# Patient Record
Sex: Male | Born: 1970 | Race: Black or African American | Hispanic: No | Marital: Married | Smoking: Never smoker
Health system: Southern US, Community
[De-identification: ages and names within clinical notes are randomized; demographics above are authoritative.]

## PROBLEM LIST (undated history)

## (undated) DIAGNOSIS — N419 Inflammatory disease of prostate, unspecified: Secondary | ICD-10-CM

## (undated) DIAGNOSIS — R972 Elevated prostate specific antigen [PSA]: Secondary | ICD-10-CM

## (undated) DIAGNOSIS — N390 Urinary tract infection, site not specified: Secondary | ICD-10-CM

## (undated) DIAGNOSIS — T7840XA Allergy, unspecified, initial encounter: Secondary | ICD-10-CM

## (undated) DIAGNOSIS — J309 Allergic rhinitis, unspecified: Secondary | ICD-10-CM

## (undated) HISTORY — DX: Allergic rhinitis, unspecified: J30.9

## (undated) HISTORY — DX: Urinary tract infection, site not specified: N39.0

## (undated) HISTORY — DX: Allergy, unspecified, initial encounter: T78.40XA

## (undated) HISTORY — DX: Inflammatory disease of prostate, unspecified: N41.9

## (undated) HISTORY — PX: OTHER SURGICAL HISTORY: SHX169

## (undated) HISTORY — DX: Elevated prostate specific antigen (PSA): R97.20

---

## 2003-07-17 ENCOUNTER — Emergency Department (HOSPITAL_COMMUNITY): Admission: EM | Admit: 2003-07-17 | Discharge: 2003-07-17 | Payer: Self-pay | Admitting: Emergency Medicine

## 2004-07-10 ENCOUNTER — Emergency Department (HOSPITAL_COMMUNITY): Admission: EM | Admit: 2004-07-10 | Discharge: 2004-07-10 | Payer: Self-pay | Admitting: Emergency Medicine

## 2009-01-30 ENCOUNTER — Emergency Department (HOSPITAL_COMMUNITY): Admission: EM | Admit: 2009-01-30 | Discharge: 2009-01-30 | Payer: Self-pay | Admitting: Emergency Medicine

## 2010-10-09 LAB — POCT I-STAT, CHEM 8
BUN: 12 mg/dL (ref 6–23)
Creatinine, Ser: 1.2 mg/dL (ref 0.4–1.5)
Glucose, Bld: 99 mg/dL (ref 70–99)
HCT: 43 % (ref 39.0–52.0)
Potassium: 3.8 mEq/L (ref 3.5–5.1)
Sodium: 139 mEq/L (ref 135–145)

## 2010-10-09 LAB — URINALYSIS, ROUTINE W REFLEX MICROSCOPIC
Bilirubin Urine: NEGATIVE
Glucose, UA: NEGATIVE mg/dL
Hgb urine dipstick: NEGATIVE
Specific Gravity, Urine: 1.009 (ref 1.005–1.030)
Urobilinogen, UA: 0.2 mg/dL (ref 0.0–1.0)

## 2017-05-05 ENCOUNTER — Ambulatory Visit (INDEPENDENT_AMBULATORY_CARE_PROVIDER_SITE_OTHER): Payer: No Typology Code available for payment source | Admitting: Physician Assistant

## 2017-05-05 ENCOUNTER — Encounter: Payer: Self-pay | Admitting: Physician Assistant

## 2017-05-05 VITALS — BP 120/78 | HR 96 | Temp 98.5°F | Resp 18 | Ht 68.0 in | Wt 177.2 lb

## 2017-05-05 DIAGNOSIS — R300 Dysuria: Secondary | ICD-10-CM | POA: Diagnosis not present

## 2017-05-05 DIAGNOSIS — Z6829 Body mass index (BMI) 29.0-29.9, adult: Secondary | ICD-10-CM | POA: Insufficient documentation

## 2017-05-05 LAB — POCT URINALYSIS DIP (MANUAL ENTRY)
BILIRUBIN UA: NEGATIVE
GLUCOSE UA: NEGATIVE mg/dL
Ketones, POC UA: NEGATIVE mg/dL
Nitrite, UA: NEGATIVE
Protein Ur, POC: NEGATIVE mg/dL
RBC UA: NEGATIVE
SPEC GRAV UA: 1.015 (ref 1.010–1.025)
Urobilinogen, UA: 2 E.U./dL — AB
pH, UA: 6.5 (ref 5.0–8.0)

## 2017-05-05 LAB — POCT CBC
GRANULOCYTE PERCENT: 68.8 % (ref 37–80)
HCT, POC: 41.1 % — AB (ref 43.5–53.7)
Hemoglobin: 14.1 g/dL (ref 14.1–18.1)
Lymph, poc: 1 (ref 0.6–3.4)
MCH: 27.6 pg (ref 27–31.2)
MCHC: 34.4 g/dL (ref 31.8–35.4)
MCV: 80.4 fL (ref 80–97)
MID (CBC): 0.8 (ref 0–0.9)
MPV: 8.1 fL (ref 0–99.8)
POC Granulocyte: 4.1 (ref 2–6.9)
POC LYMPH %: 17.2 % (ref 10–50)
POC MID %: 14 % — AB (ref 0–12)
Platelet Count, POC: 151 10*3/uL (ref 142–424)
RBC: 5.12 M/uL (ref 4.69–6.13)
RDW, POC: 12.8 %
WBC: 6 10*3/uL (ref 4.6–10.2)

## 2017-05-05 LAB — POC MICROSCOPIC URINALYSIS (UMFC): MUCUS RE: ABSENT

## 2017-05-05 MED ORDER — SULFAMETHOXAZOLE-TRIMETHOPRIM 800-160 MG PO TABS: 1.0000 | ORAL_TABLET | Freq: Two times a day (BID) | ORAL | 0 refills | Status: AC

## 2017-05-05 NOTE — Progress Notes (Signed)
   Subjective:    Patient ID: Phillip Mccoy, male    DOB: [birth year], 46 y.o.   MRN: [id number]  HPI   Phillip Mccoy is a 46 year old African American male with a family history significant for prostate cancer who presents today with a chief complaint of "prostate issues". Phillip Mccoy reports he was seen at [facility] 04/18/17 with urinary symptoms and fever that had been occurring for the past 3 days. At that time he tested negative for STDs as well as urinary tract infection. He was treated with a 7 day course of Keflex 500mg  4 times per day. Phillip Mccoy reports the symptoms resolved but returned 5 days after finishing the course of antibiotics. Phillip Mccoy states his urine stream will initially start off strong, but then will start to dribble. Patient reports he will have a burning sensation on and off. He also states he had a fever and chills on 05/03/17. Phillip Mccoy took Tylenol helped which helped with the fever, but did not help the other symptoms. He endorse discomfort, but denies any pain. Phillip Mccoy states nothing seems to make the symptoms worse or better. He denies any changes in urine color or hematuria.  Phillip Mccoy reports he is married and occasionally has sexual intercourse with his wife. He denies any other sexual partners. He denies any erectile dysfunction or issues with ejaculation. He also denies penile discharge.  Medications:  Prior to Admission medications   Not on File   Allergies:  Allergies  Allergen Reactions  . Penicillins Rash    This is a new finding as of 05/2012   Chronic Medical Conditions:  Patient Active Problem List   Diagnosis Date Noted  . BMI 29.0-29.9,adult 05/05/2017   Family History:  Family History  Problem Relation Age of Onset  . Diabetes Mother   . Hyperlipidemia Mother   . Cancer Father   . Hyperlipidemia Father     Review of Systems     Objective:   Physical Exam  Constitutional: He appears well-developed and well-nourished. He is active and  cooperative. No distress.  BP 120/78 (BP Location: Left Arm, Patient Position: Sitting, Cuff Size: Normal)   Pulse 96   Temp 98.5 F (36.9 C) (Oral)   Resp 18   Ht 5\' 8"  (1.727 m)   Wt 177 lb 3.2 oz (80.4 kg)   SpO2 98%   BMI 26.94 kg/m    HENT:  Head: Normocephalic and atraumatic.  Neck: Normal range of motion. Neck supple. No thyromegaly present.  Cardiovascular: Normal rate, regular rhythm, S1 normal, S2 normal, normal heart sounds and intact distal pulses.   No murmur heard. Pulses:      Radial pulses are 2+ on the right side, and 2+ on the left side.  Pulmonary/Chest: Effort normal and breath sounds normal.  Lymphadenopathy:    He has no cervical adenopathy.  Neurological: He is alert.  Skin: Skin is warm and dry.      Assessment & Plan:  1. Dysuria - POCT urinalysis dipstick obtained today in clinic - POCT Microscopic Urinalysis ([facility]) obtained today in clinic - POCT CBC obtained today in clinic - Urine sent for culture - PSA obtained today in clinic - Sulfamethoxazole-trimethoprim 800-160mg  BID  - Patient to follow-up in 4 weeks for re-evaluation or sooner if symptoms do not resolve  Sisto Granillo, PA-S

## 2017-05-05 NOTE — Patient Instructions (Signed)
     IF you received an x-ray today, you will receive an invoice from [facility]. Please contact [facility] at [phone] with questions or concerns regarding your invoice.   IF you received labwork today, you will receive an invoice from [facility]. Please contact [facility] at [phone] with questions or concerns regarding your invoice.   Our billing staff will not be able to assist you with questions regarding bills from these companies.  You will be contacted with the lab results as soon as they are available. The fastest way to get your results is to activate your My Chart account. Instructions are located on the last page of this paperwork. If you have not heard from us regarding the results in 2 weeks, please contact this office.     

## 2017-05-05 NOTE — Progress Notes (Signed)
Patient ID: Phillip Mccoy, male     DOB: 1970/12/30, 46 y.o.    MRN: [id number]  PCP: Biagio Borg, MD  Chief Complaint  Patient presents with  . Dysuria    x3 weeks, pt reports fever, burning sensation, leakage, pt states he went to [facility] (x2 weeks ago) and the ruled out STDs and UTI and was given an antibotic and the symptoms cleared up but they return on Monday. Pt thinks he may be a prostate infection.    Subjective:   This patient is new to me and presents for evaluation of possible prostate problems.  He first noticed symptoms about 3 weeks ago: fever, burning with urination, urinary leakage. He presented to another facility where he reports a very thorough evaluation, including UA that was reportedly normal, DRE which was uncomfortable but not painful, and STI screening that was ultimately negative. He was treated with cephalexin 500 mg QID x 7 days.  His symptoms resolved, but then recurred on 04/30/2017. He did some reading online and thinks he might have prostatitis.   Urinary burning is intermittent. Urine stream starts strong and then ends in a dribble. Low grade fever resolves temporarily with acetaminophen.  He is sexually active only with his wife, and infrequently. No penile discharge, lesions or swollen/tender lymph nodes.  Review of Systems As above. No nausea, vomiting, diarrhea.  Prior to Admission medications   Not on File     Allergies  Allergen Reactions  . Penicillins Rash    This is a new finding as of 05/2012     Patient Active Problem List   Diagnosis Date Noted  . BMI 29.0-29.9,adult 05/05/2017     Family History  Problem Relation Age of Onset  . Diabetes Mother   . Hyperlipidemia Mother   . Cancer Father        Bone cancer  . Hyperlipidemia Father   . COPD Sister   . Cancer Brother 56       prostate  . Lupus Sister      Social History   Social History  . Marital status: Married    Spouse name: N/A  . Number of  children: 1  . Years of education: some college   Occupational History  . [employer] painting [employer]    Social History Main Topics  . Smoking status: Never Smoker  . Smokeless tobacco: Never Used  . Alcohol use No  . Drug use: No  . Sexual activity: Yes    Partners: Female   Other Topics Concern  . Not on file   Social History Narrative   Lives with his wife and nieces (3).   His daughter is in college at [employer].         Objective:  Physical Exam  Constitutional: He is oriented to person, place, and time. He appears well-developed and well-nourished. He is active and cooperative. No distress.  BP 120/78 (BP Location: Left Arm, Patient Position: Sitting, Cuff Size: Normal)   Pulse 96   Temp 98.5 F (36.9 C) (Oral)   Resp 18   Ht 5\' 8"  (1.727 m)   Wt 177 lb 3.2 oz (80.4 kg)   SpO2 98%   BMI 26.94 kg/m   HENT:  Head: Normocephalic and atraumatic.  Right Ear: Hearing normal.  Left Ear: Hearing normal.  Eyes: Conjunctivae are normal. No scleral icterus.  Neck: Normal range of motion. Neck supple. No thyromegaly present.  Cardiovascular: Normal rate, regular rhythm and normal  heart sounds.   Pulses:      Radial pulses are 2+ on the right side, and 2+ on the left side.  Pulmonary/Chest: Effort normal and breath sounds normal.  Abdominal: Soft. Normal appearance and bowel sounds are normal. There is no hepatosplenomegaly. There is no tenderness. There is no CVA tenderness. No hernia.  Lymphadenopathy:       Head (right side): No tonsillar, no preauricular, no posterior auricular and no occipital adenopathy present.       Head (left side): No tonsillar, no preauricular, no posterior auricular and no occipital adenopathy present.    He has no cervical adenopathy.       Right: No supraclavicular adenopathy present.       Left: No supraclavicular adenopathy present.  Neurological: He is alert and oriented to person, place, and time. No sensory deficit.  Skin: Skin is warm, dry  and intact. No rash noted. No cyanosis or erythema. Nails show no clubbing.  Psychiatric: He has a normal mood and affect. His speech is normal and behavior is normal.         Results for orders placed or performed in visit on 05/05/17  POCT urinalysis dipstick  Result Value Ref Range   Color, UA yellow yellow   Clarity, UA clear clear   Glucose, UA negative negative mg/dL   Bilirubin, UA negative negative   Ketones, POC UA negative negative mg/dL   Spec Grav, UA 1.015 1.010 - 1.025   Blood, UA negative negative   pH, UA 6.5 5.0 - 8.0   Protein Ur, POC negative negative mg/dL   Urobilinogen, UA 2.0 (A) 0.2 or 1.0 E.U./dL   Nitrite, UA Negative Negative   Leukocytes, UA Small (1+) (A) Negative  POCT Microscopic Urinalysis ([facility])  Result Value Ref Range   WBC,UR,HPF,POC None None WBC/hpf   RBC,UR,HPF,POC None None RBC/hpf   Bacteria None None, Too numerous to count   Mucus Absent Absent   Epithelial Cells, UR Per Microscopy None None, Too numerous to count cells/hpf  POCT CBC  Result Value Ref Range   WBC 6.0 4.6 - 10.2 K/uL   Lymph, poc 1.0 0.6 - 3.4   POC LYMPH PERCENT 17.2 10 - 50 %L   MID (cbc) 0.8 0 - 0.9   POC MID % 14.0 (A) 0 - 12 %M   POC Granulocyte 4.1 2 - 6.9   Granulocyte percent 68.8 37 - 80 %G   RBC 5.12 4.69 - 6.13 M/uL   Hemoglobin 14.1 14.1 - 18.1 g/dL   HCT, POC 41.1 (A) 43.5 - 53.7 %   MCV 80.4 80 - 97 fL   MCH, POC 27.6 27 - 31.2 pg   MCHC 34.4 31.8 - 35.4 g/dL   RDW, POC 12.8 %   Platelet Count, POC 151 142 - 424 K/uL   MPV 8.1 0 - 99.8 fL       Assessment & Plan:  1. Dysuria Treat for prostatitis, pending UCx and PSA. Plan re-evaluation in 4 weeks, sooner if symptoms worsen/persist. Consider BPH if burning and fever resolve but other urinary symptoms persist. - POCT urinalysis dipstick - POCT Microscopic Urinalysis ([facility]) - POCT CBC - Urine Culture - PSA - sulfamethoxazole-trimethoprim (BACTRIM DS,SEPTRA DS) 800-160 MG tablet; Take 1  tablet by mouth 2 (two) times daily.  Dispense: 42 tablet; Refill: 0    Return in about 4 weeks (around 06/02/2017), or if symptoms worsen or fail to improve, for re-evaluation of prostate issues.   Gordon Vandunk  Janalee Dane, PA-C [facility]

## 2017-05-06 LAB — PSA: PROSTATE SPECIFIC AG, SERUM: 8.8 ng/mL — AB (ref 0.0–4.0)

## 2017-05-09 LAB — URINE CULTURE

## 2017-05-28 ENCOUNTER — Telehealth: Payer: Self-pay | Admitting: *Deleted

## 2017-05-28 ENCOUNTER — Telehealth: Payer: Self-pay | Admitting: Internal Medicine

## 2017-05-28 NOTE — Telephone Encounter (Signed)
Patient returned call regarding his lab results- patient notified of results and will call back if symptoms return. He states he is doing much better and plans to follow up as discussed.

## 2017-05-28 NOTE — Telephone Encounter (Signed)
Lm to call back for lab results 

## 2017-05-31 ENCOUNTER — Telehealth: Payer: Self-pay | Admitting: *Deleted

## 2017-05-31 NOTE — Telephone Encounter (Signed)
Per pt he is not having any problems. He stated that he feels good, he has taken all of his medication, if anything "happens", he will schedule an appointment. He will see Dr. Judi Cong in January.

## 2017-07-13 ENCOUNTER — Ambulatory Visit: Payer: No Typology Code available for payment source | Admitting: Internal Medicine

## 2017-07-13 ENCOUNTER — Encounter: Payer: Self-pay | Admitting: Internal Medicine

## 2017-07-13 DIAGNOSIS — N390 Urinary tract infection, site not specified: Secondary | ICD-10-CM | POA: Insufficient documentation

## 2017-07-13 DIAGNOSIS — Z0001 Encounter for general adult medical examination with abnormal findings: Secondary | ICD-10-CM | POA: Insufficient documentation

## 2017-07-13 DIAGNOSIS — J309 Allergic rhinitis, unspecified: Secondary | ICD-10-CM | POA: Insufficient documentation

## 2017-07-13 DIAGNOSIS — N419 Inflammatory disease of prostate, unspecified: Secondary | ICD-10-CM | POA: Insufficient documentation

## 2017-07-13 DIAGNOSIS — R972 Elevated prostate specific antigen [PSA]: Secondary | ICD-10-CM | POA: Insufficient documentation

## 2017-07-13 DIAGNOSIS — Z0000 Encounter for general adult medical examination without abnormal findings: Secondary | ICD-10-CM

## 2017-07-13 NOTE — Progress Notes (Signed)
Subjective:    Patient ID: Phillip Mccoy, male    DOB: September 23, 1970, 47 y.o.   MRN: [id number]  HPI:  Here for wellness and f/u;  Overall doing ok;  Pt denies Chest pain, worsening SOB, DOE, wheezing, orthopnea, PND, worsening LE edema, palpitations, dizziness or syncope.  Pt denies neurological change such as new headache, facial or extremity weakness.  Pt denies polydipsia, polyuria, or low sugar symptoms. Pt states overall good compliance with treatment and medications, good tolerability, and has been trying to follow appropriate diet.  Pt denies worsening depressive symptoms, suicidal ideation or panic. No fever, night sweats, wt loss, loss of appetite, or other constitutional symptoms.  Pt states good ability with ADL's, has low fall risk, home safety reviewed and adequate, no other significant changes in hearing or vision, and only occasionally active with exercise.  Also seen and tx for uti and prostatitis nov 2018 s/p 21 days antibx. Denies urinary symptoms such as dysuria, frequency, urgency, flank pain, hematuria or n/v, fever, chills.  Has some bloating and somewhat irreg bowel habit with mild recurring constipation he takes cathartic and does ok. Past Medical History:  Diagnosis Date  . Allergic rhinitis   . Elevated PSA   . Prostatitis   . UTI (urinary tract infection)    No past surgical history on file.  reports that  has never smoked. he has never used smokeless tobacco. He reports that he does not drink alcohol or use drugs. family history includes COPD in his sister; Cancer in his father; Cancer (age of onset: 60) in his brother; Diabetes in his mother; Hyperlipidemia in his father and mother; Lupus in his sister. Allergies  Allergen Reactions  . Penicillins Rash    This is a new finding as of 05/2012   No current outpatient medications on file prior to visit.   No current facility-administered medications on file prior to visit.    Review of Systems Constitutional:  Negative for other unusual diaphoresis, sweats, appetite or weight changes HENT: Negative for other worsening hearing loss, ear pain, facial swelling, mouth sores or neck stiffness.   Eyes: Negative for other worsening pain, redness or other visual disturbance.  Respiratory: Negative for other stridor or swelling Cardiovascular: Negative for other palpitations or other chest pain  Gastrointestinal: Negative for worsening diarrhea or loose stools, blood in stool, distention or other pain Genitourinary: Negative for hematuria, flank pain or other change in urine volume.  Musculoskeletal: Negative for myalgias or other joint swelling.  Skin: Negative for other color change, or other wound or worsening drainage.  Neurological: Negative for other syncope or numbness. Hematological: Negative for other adenopathy or swelling Psychiatric/Behavioral: Negative for hallucinations, other worsening agitation, SI, self-injury, or new decreased concentration All other system neg per pt    Objective:   Physical Exam BP 120/76   Pulse 80   Temp 97.9 F (36.6 C) (Oral)   Ht 5\' 8"  (1.727 m)   Wt 180 lb (81.6 kg)   SpO2 98%   BMI 27.37 kg/m  VS noted,  Constitutional: Pt is oriented to person, place, and time. Appears well-developed and well-nourished, in no significant distress and comfortable Head: Normocephalic and atraumatic  Eyes: Conjunctivae and EOM are normal. Pupils are equal, round, and reactive to light Right Ear: External ear normal without discharge Left Ear: External ear normal without discharge Nose: Nose without discharge or deformity Mouth/Throat: Oropharynx is without other ulcerations and moist  Neck: Normal range of motion. Neck supple. No  JVD present. No tracheal deviation present or significant neck LA or mass Cardiovascular: Normal rate, regular rhythm, normal heart sounds and intact distal pulses.   Pulmonary/Chest: WOB normal and breath sounds without rales or wheezing    Abdominal: Soft. Bowel sounds are normal. NT. No HSM  Musculoskeletal: Normal range of motion. Exhibits no edema Lymphadenopathy: Has no other cervical adenopathy.  Neurological: Pt is alert and oriented to person, place, and time. Pt has normal reflexes. No cranial nerve deficit. Motor grossly intact, Gait intact Skin: Skin is warm and dry. No rash noted or new ulcerations Psychiatric:  Has normal mood and affect. Behavior is normal without agitation No other exam findings    Assessment & Plan:

## 2017-07-13 NOTE — Patient Instructions (Signed)
Please continue all other medications as before, and refills have been done if requested.  Please have the pharmacy call with any other refills you may need.  Please continue your efforts at being more active, low cholesterol diet, and weight control.  You are otherwise up to date with prevention measures today.  Please keep your appointments with your specialists as you may have planned  Please return in 1 year for your yearly visit, or sooner if needed, with Lab testing done 3-5 days before  

## 2017-07-15 ENCOUNTER — Encounter: Payer: Self-pay | Admitting: Internal Medicine

## 2017-07-15 NOTE — Assessment & Plan Note (Signed)

## 2018-05-10 ENCOUNTER — Ambulatory Visit (INDEPENDENT_AMBULATORY_CARE_PROVIDER_SITE_OTHER)
Admission: RE | Admit: 2018-05-10 | Discharge: 2018-05-10 | Disposition: A | Discharge status: Home / Self Care | Payer: No Typology Code available for payment source | Source: Ambulatory Visit | Attending: Internal Medicine | Admitting: Internal Medicine

## 2018-05-10 ENCOUNTER — Encounter: Payer: Self-pay | Admitting: Internal Medicine

## 2018-05-10 ENCOUNTER — Ambulatory Visit: Payer: No Typology Code available for payment source | Admitting: Internal Medicine

## 2018-05-10 ENCOUNTER — Other Ambulatory Visit (INDEPENDENT_AMBULATORY_CARE_PROVIDER_SITE_OTHER): Payer: No Typology Code available for payment source

## 2018-05-10 VITALS — BP 116/76 | HR 94 | Temp 98.2°F | Ht 68.0 in | Wt 179.0 lb

## 2018-05-10 DIAGNOSIS — N419 Inflammatory disease of prostate, unspecified: Secondary | ICD-10-CM

## 2018-05-10 DIAGNOSIS — R1032 Left lower quadrant pain: Secondary | ICD-10-CM

## 2018-05-10 DIAGNOSIS — N390 Urinary tract infection, site not specified: Secondary | ICD-10-CM | POA: Diagnosis not present

## 2018-05-10 LAB — URINALYSIS, ROUTINE W REFLEX MICROSCOPIC
Bilirubin Urine: NEGATIVE
HGB URINE DIPSTICK: NEGATIVE
KETONES UR: NEGATIVE
Leukocytes, UA: NEGATIVE
NITRITE: NEGATIVE
Specific Gravity, Urine: 1.015 (ref 1.000–1.030)
Total Protein, Urine: NEGATIVE
URINE GLUCOSE: NEGATIVE
Urobilinogen, UA: 1 (ref 0.0–1.0)
pH: 7.5 (ref 5.0–8.0)

## 2018-05-10 NOTE — Progress Notes (Signed)
   Subjective:    Patient ID: Phillip Mccoy, male    DOB: 07/24/1970, 47 y.o.   MRN: [id number]  HPI   Here with c/o left groin pain mild intermittent with pain radiating to the left testicle as well as possible mild swelling.  Non tender to palpate, no hx of renal stones and Denies urinary symptoms such as dysuria, frequency, urgency, flank pain, hematuria or n/v, fever, chills.  May be worse to stand up and walk but doesn't last.  Pt denies chest pain, increased sob or doe, wheezing, orthopnea, PND, increased LE swelling, palpitations, dizziness or syncope.  Pt denies new neurological symptoms such as new headache, or facial or extremity weakness or numbness   Pt denies polydipsia, polyuria Past Medical History:  Diagnosis Date  . Allergic rhinitis   . Elevated PSA   . Prostatitis   . UTI (urinary tract infection)    No past surgical history on file.  reports that he has never smoked. He has never used smokeless tobacco. He reports that he does not drink alcohol or use drugs. family history includes COPD in his sister; Cancer in his father; Cancer (age of onset: 5) in his brother; Diabetes in his mother; Hyperlipidemia in his father and mother; Lupus in his sister. Allergies  Allergen Reactions  . Penicillins Rash    This is a new finding as of 05/2012   No current outpatient medications on file prior to visit.   No current facility-administered medications on file prior to visit.    Review of Systems  Constitutional: Negative for other unusual diaphoresis or sweats HENT: Negative for ear discharge or swelling Eyes: Negative for other worsening visual disturbances Respiratory: Negative for stridor or other swelling  Gastrointestinal: Negative for worsening distension or other blood Genitourinary: Negative for retention or other urinary change Musculoskeletal: Negative for other MSK pain or swelling Skin: Negative for color change or other new lesions Neurological: Negative for  worsening tremors and other numbness  Psychiatric/Behavioral: Negative for worsening agitation or other fatigue All other system neg per pt    Objective:   Physical Exam BP 116/76   Pulse 94   Temp 98.2 F (36.8 C) (Oral)   Ht 5\' 8"  (1.727 m)   Wt 179 lb (81.2 kg)   SpO2 96%   BMI 27.22 kg/m  VS noted,  Constitutional: Pt appears in NAD HENT: Head: NCAT.  Right Ear: External ear normal.  Left Ear: External ear normal.  Eyes: . Pupils are equal, round, and reactive to light. Conjunctivae and EOM are normal Nose: without d/c or deformity Neck: Neck supple. Gross normal ROM Cardiovascular: Normal rate and regular rhythm.   Pulmonary/Chest: Effort normal and breath sounds without rales or wheezing.  Abd:  Soft, NT, ND, + BS, no organomegaly Bilat hips with FROM and no pain elicited on ROM including int and ext rotation GU with normal male genitalia, may have small swelling to left groin area of concern but NT and may be fatty tissue Neurological: Pt is alert. At baseline orientation, motor grossly intact Skin: Skin is warm. No rashes, other new lesions, no LE edema Psychiatric: Pt behavior is normal without agitation  No other exam findings   Assessment & Plan:

## 2018-05-10 NOTE — Patient Instructions (Signed)
Please continue all other medications as before, and refills have been done if requested.  Please have the pharmacy call with any other refills you may need.  Please keep your appointments with your specialists as you may have planned  You will be contacted regarding the referral for: ultrasound  Please go to the XRAY Department in the Basement (go straight as you get off the elevator) for the x-ray testing - the hip films  Please go to the LAB in the Basement (turn left off the elevator) for the tests to be done today - just the urine testing today  You will be contacted by phone if any changes need to be made immediately.  Otherwise, you will receive a letter about your results with an explanation, but please check with MyChart first.  Please remember to sign up for MyChart if you have not done so, as this will be important to you in the future with finding out test results, communicating by private email, and scheduling acute appointments online when needed.

## 2018-05-11 NOTE — Assessment & Plan Note (Signed)
Hx only, currently asmpt, for UA with labs but doubt UTI

## 2018-05-11 NOTE — Assessment & Plan Note (Signed)
Exam benign, does not appear to need antibx

## 2018-05-11 NOTE — Assessment & Plan Note (Signed)
Etiology unclear, with differential including renal stone, hip OA, or hernia;  For UA, left hip film, and scrotal u/s,  to f/u any worsening symptoms or concerns

## 2018-05-13 ENCOUNTER — Telehealth: Payer: Self-pay | Admitting: Internal Medicine

## 2018-05-13 ENCOUNTER — Encounter: Payer: Self-pay | Admitting: Internal Medicine

## 2018-05-13 NOTE — Telephone Encounter (Signed)
Patient came by the office checking on the status of his xray results. Patient also mentioned that he received a call from [facility] to schedule an appointment but was under the impression that he would need to wait for the xray results prior to having the ultrasound. Please advise.

## 2018-05-13 NOTE — Telephone Encounter (Signed)
See other lab /xray notes, thanks

## 2018-05-13 NOTE — Addendum Note (Signed)
Addended by: Biagio Borg on: 05/13/2018 06:55 PM   Modules accepted: Orders

## 2018-05-14 ENCOUNTER — Telehealth: Payer: Self-pay

## 2018-05-14 ENCOUNTER — Other Ambulatory Visit: Payer: Self-pay | Admitting: Internal Medicine

## 2018-05-14 DIAGNOSIS — R1032 Left lower quadrant pain: Secondary | ICD-10-CM

## 2018-05-14 NOTE — Telephone Encounter (Signed)
Pt has been informed of results and expressed understanding.  °

## 2018-05-14 NOTE — Telephone Encounter (Signed)
Copied from [place] [phone] [id number]. Topic: General - Other >> May 14, 2018  2:26 PM Vernona Rieger wrote: Reason for CRM:  Sarah with [facility] called and wanted to know if on the order for Ultra Sound Scrotum Dopplar, are they looking at anything besides the groin? If not , it would need to be just ultra sound of the pelvis. The code for that is [id number] and that can just be changed in epic. She would like a call back @ [phone] ext [phone]

## 2018-05-14 NOTE — Telephone Encounter (Signed)
Ok this is done 

## 2018-05-14 NOTE — Telephone Encounter (Signed)
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  We can consider MRI if not improved, and the scrotal ultrasound is being ordered.  Redmond Baseman to please inform pt

## 2018-05-14 NOTE — Telephone Encounter (Signed)
Called pt, LVM.   

## 2018-05-14 NOTE — Telephone Encounter (Signed)
Pt has been informed and expressed understanding.  

## 2018-05-20 ENCOUNTER — Other Ambulatory Visit: Payer: No Typology Code available for payment source

## 2018-05-20 ENCOUNTER — Other Ambulatory Visit: Payer: Self-pay | Admitting: Internal Medicine

## 2018-05-20 ENCOUNTER — Ambulatory Visit
Admission: RE | Admit: 2018-05-20 | Discharge: 2018-05-20 | Disposition: A | Discharge status: Home / Self Care | Payer: No Typology Code available for payment source | Source: Ambulatory Visit | Attending: Internal Medicine | Admitting: Internal Medicine

## 2018-05-20 DIAGNOSIS — R1032 Left lower quadrant pain: Secondary | ICD-10-CM

## 2018-05-21 ENCOUNTER — Encounter: Payer: Self-pay | Admitting: Internal Medicine

## 2018-05-24 ENCOUNTER — Telehealth: Payer: Self-pay | Admitting: Internal Medicine

## 2018-05-24 NOTE — Telephone Encounter (Signed)
PCP mailed out letter

## 2018-05-24 NOTE — Telephone Encounter (Signed)
Copied from [place] [id number]. Topic: Quick Communication - See Telephone Encounter >> May 24, 2018 11:53 AM Ahmed Prima L wrote: CRM for notification. See Telephone encounter for: 05/24/18.  Patient said he had an ultra sound done on Monday, November 18th. He said he has not yet received the results.

## 2018-07-18 ENCOUNTER — Encounter: Payer: No Typology Code available for payment source | Admitting: Internal Medicine

## 2018-07-18 DIAGNOSIS — Z0289 Encounter for other administrative examinations: Secondary | ICD-10-CM

## 2018-07-25 ENCOUNTER — Encounter: Payer: Self-pay | Admitting: Internal Medicine

## 2018-08-06 ENCOUNTER — Encounter: Payer: Self-pay | Admitting: Internal Medicine

## 2018-08-06 ENCOUNTER — Ambulatory Visit (INDEPENDENT_AMBULATORY_CARE_PROVIDER_SITE_OTHER): Payer: 59 | Admitting: Internal Medicine

## 2018-08-06 ENCOUNTER — Other Ambulatory Visit (INDEPENDENT_AMBULATORY_CARE_PROVIDER_SITE_OTHER): Payer: 59

## 2018-08-06 VITALS — BP 122/76 | HR 98 | Temp 98.4°F | Ht 68.0 in | Wt 175.0 lb

## 2018-08-06 DIAGNOSIS — Z0000 Encounter for general adult medical examination without abnormal findings: Secondary | ICD-10-CM

## 2018-08-06 DIAGNOSIS — J309 Allergic rhinitis, unspecified: Secondary | ICD-10-CM

## 2018-08-06 LAB — URINALYSIS, ROUTINE W REFLEX MICROSCOPIC
Bilirubin Urine: NEGATIVE
Hgb urine dipstick: NEGATIVE
Leukocytes, UA: NEGATIVE
Nitrite: NEGATIVE
PH: 6 (ref 5.0–8.0)
RBC / HPF: NONE SEEN (ref 0–?)
Specific Gravity, Urine: >=1.03 — AB (ref 1.000–1.030)
TOTAL PROTEIN, URINE-UPE24: NEGATIVE
Urine Glucose: NEGATIVE
Urobilinogen, UA: 0.2 (ref 0.0–1.0)

## 2018-08-06 LAB — CBC WITH DIFFERENTIAL/PLATELET
Basophils Absolute: 0.1 10*3/uL (ref 0.0–0.1)
Basophils Relative: 0.6 % (ref 0.0–3.0)
Eosinophils Absolute: 0.2 10*3/uL (ref 0.0–0.7)
Eosinophils Relative: 1.2 % (ref 0.0–5.0)
HEMATOCRIT: 40.4 % (ref 39.0–52.0)
Hemoglobin: 14.1 g/dL (ref 13.0–17.0)
Lymphocytes Relative: 23 % (ref 12.0–46.0)
Lymphs Abs: 2.8 10*3/uL (ref 0.7–4.0)
MCHC: 34.9 g/dL (ref 30.0–36.0)
MCV: 79.4 fl (ref 78.0–100.0)
Monocytes Absolute: 1.1 10*3/uL — ABNORMAL HIGH (ref 0.1–1.0)
Monocytes Relative: 9 % (ref 3.0–12.0)
NEUTROS PCT: 66.2 % (ref 43.0–77.0)
Neutro Abs: 8.2 10*3/uL — ABNORMAL HIGH (ref 1.4–7.7)
PLATELETS: 127 10*3/uL — AB (ref 150.0–400.0)
RBC: 5.08 Mil/uL (ref 4.22–5.81)
RDW: 13.4 % (ref 11.5–15.5)
WBC: 12.3 10*3/uL — ABNORMAL HIGH (ref 4.0–10.5)

## 2018-08-06 LAB — BASIC METABOLIC PANEL
BUN: 14 mg/dL (ref 6–23)
CO2: 31 mEq/L (ref 19–32)
Calcium: 9.5 mg/dL (ref 8.4–10.5)
Chloride: 103 mEq/L (ref 96–112)
Creatinine, Ser: 1.15 mg/dL (ref 0.40–1.50)
GFR: 81.48 mL/min (ref >60.00)
Glucose, Bld: 78 mg/dL (ref 70–99)
Potassium: 3.9 mEq/L (ref 3.5–5.1)
Sodium: 140 mEq/L (ref 135–145)

## 2018-08-06 LAB — HEPATIC FUNCTION PANEL
ALBUMIN: 4.4 g/dL (ref 3.5–5.2)
ALT: 34 U/L (ref 0–53)
AST: 21 U/L (ref 0–37)
Alkaline Phosphatase: 36 U/L — ABNORMAL LOW (ref 39–117)
Bilirubin, Direct: 0.2 mg/dL (ref 0.0–0.3)
TOTAL PROTEIN: 6.3 g/dL (ref 6.0–8.3)
Total Bilirubin: 1.6 mg/dL — ABNORMAL HIGH (ref 0.2–1.2)

## 2018-08-06 LAB — LIPID PANEL
Cholesterol: 189 mg/dL (ref 0–200)
HDL: 64.1 mg/dL (ref >39.00)
LDL CALC: 111 mg/dL — AB (ref 0–99)
NonHDL: 125.01
Total CHOL/HDL Ratio: 3
Triglycerides: 71 mg/dL (ref 0.0–149.0)
VLDL: 14.2 mg/dL (ref 0.0–40.0)

## 2018-08-06 LAB — PSA: PSA: 0.86 ng/mL (ref 0.10–4.00)

## 2018-08-06 LAB — TSH: TSH: 1.73 u[IU]/mL (ref 0.35–4.50)

## 2018-08-06 NOTE — Patient Instructions (Addendum)
You will be contacted regarding the referral for: colonoscopy  Please take all new medication as prescribed - the singulair for allergies  Please continue all other medications as before, and refills have been done if requested.  Please have the pharmacy call with any other refills you may need.  Please continue your efforts at being more active, low cholesterol diet, and weight control.  You are otherwise up to date with prevention measures today.  Please keep your appointments with your specialists as you may have planned  Please go to the LAB in the Basement (turn left off the elevator) for the tests to be done today  You will be contacted by phone if any changes need to be made immediately.  Otherwise, you will receive a letter about your results with an explanation, but please check with MyChart first.  Please remember to sign up for MyChart if you have not done so, as this will be important to you in the future with finding out test results, communicating by private email, and scheduling acute appointments online when needed.  Please return in 1 year for your yearly visit, or sooner if needed, with Lab testing done 3-5 days before

## 2018-08-06 NOTE — Progress Notes (Signed)
Subjective:    Patient ID: Phillip Mccoy, male    DOB: 01/13/1971, 48 y.o.   MRN: [id number]  HPI  Here for wellness and f/u;  Overall doing ok;  Pt denies Chest pain, worsening SOB, DOE, wheezing, orthopnea, PND, worsening LE edema, palpitations, dizziness or syncope.  Pt denies neurological change such as new headache, facial or extremity weakness.  Pt denies polydipsia, polyuria, or low sugar symptoms. Pt states overall good compliance with treatment and medications, good tolerability, and has been trying to follow appropriate diet.  Pt denies worsening depressive symptoms, suicidal ideation or panic. No fever, night sweats, wt loss, loss of appetite, or other constitutional symptoms.  Pt states good ability with ADL's, has low fall risk, home safety reviewed and adequate, no other significant changes in hearing or vision, and only occasionally active with exercise. Does have several wks ongoing nasal allergy symptoms with clearish congestion, itch and sneezing, without fever, pain, ST, cough, swelling or wheezing.   Past Medical History:  Diagnosis Date  . Allergic rhinitis   . Elevated PSA   . Prostatitis   . UTI (urinary tract infection)    No past surgical history on file.  reports that he has never smoked. He has never used smokeless tobacco. He reports that he does not drink alcohol or use drugs. family history includes COPD in his sister; Cancer in his father; Cancer (age of onset: 42) in his brother; Diabetes in his mother; Hyperlipidemia in his father and mother; Lupus in his sister. Allergies  Allergen Reactions  . Penicillins Rash    This is a new finding as of 05/2012   No current outpatient medications on file prior to visit.   No current facility-administered medications on file prior to visit.    Review of Systems Constitutional: Negative for other unusual diaphoresis, sweats, appetite or weight changes HENT: Negative for other worsening hearing loss, ear pain, facial  swelling, mouth sores or neck stiffness.   Eyes: Negative for other worsening pain, redness or other visual disturbance.  Respiratory: Negative for other stridor or swelling Cardiovascular: Negative for other palpitations or other chest pain  Gastrointestinal: Negative for worsening diarrhea or loose stools, blood in stool, distention or other pain Genitourinary: Negative for hematuria, flank pain or other change in urine volume.  Musculoskeletal: Negative for myalgias or other joint swelling.  Skin: Negative for other color change, or other wound or worsening drainage.  Neurological: Negative for other syncope or numbness. Hematological: Negative for other adenopathy or swelling Psychiatric/Behavioral: Negative for hallucinations, other worsening agitation, SI, self-injury, or new decreased concentration ALl other system neg per pt    Objective:   Physical Exam BP 122/76   Pulse 98   Temp 98.4 F (36.9 C) (Oral)   Ht 5\' 8"  (1.727 m)   Wt 175 lb (79.4 kg)   SpO2 97%   BMI 26.61 kg/m  VS noted,  Constitutional: Pt is oriented to person, place, and time. Appears well-developed and well-nourished, in no significant distress and comfortable Head: Normocephalic and atraumatic  Eyes: Conjunctivae and EOM are normal. Pupils are equal, round, and reactive to light Right Ear: External ear normal without discharge Left Ear: External ear normal without discharge Nose: Nose without discharge or deformity Mouth/Throat: Oropharynx is without other ulcerations and moist  Neck: Normal range of motion. Neck supple. No JVD present. No tracheal deviation present or significant neck LA or mass Cardiovascular: Normal rate, regular rhythm, normal heart sounds and intact distal pulses.  Pulmonary/Chest: WOB normal and breath sounds without rales or wheezing  Abdominal: Soft. Bowel sounds are normal. NT. No HSM  Musculoskeletal: Normal range of motion. Exhibits no edema Lymphadenopathy: Has no other  cervical adenopathy.  Neurological: Pt is alert and oriented to person, place, and time. Pt has normal reflexes. No cranial nerve deficit. Motor grossly intact, Gait intact Skin: Skin is warm and dry. No rash noted or new ulcerations Psychiatric:  Has normal mood and affect. Behavior is normal without agitation No other exam findings Lab Results  Component Value Date   WBC 6.0 05/05/2017   HGB 14.1 05/05/2017   HCT 41.1 (A) 05/05/2017   GLUCOSE 99 01/30/2009   NA 139 01/30/2009   K 3.8 01/30/2009   CL 103 01/30/2009   CREATININE 1.2 01/30/2009   BUN 12 01/30/2009          Assessment & Plan:

## 2018-08-06 NOTE — Assessment & Plan Note (Signed)
Mild, for singulair 10 qd

## 2018-08-06 NOTE — Assessment & Plan Note (Signed)

## 2018-08-07 LAB — HIV ANTIBODY (ROUTINE TESTING W REFLEX): HIV: NONREACTIVE

## 2018-09-10 ENCOUNTER — Encounter: Payer: Self-pay | Admitting: Gastroenterology

## 2018-09-16 ENCOUNTER — Telehealth: Payer: Self-pay | Admitting: *Deleted

## 2018-09-16 NOTE — Telephone Encounter (Signed)
Covid-19 travel screening questions  Have you traveled in the last 14 days? If yes where?  Do you now or have you had a fever in the last 14 days?  Do you have any respiratory symptoms of shortness of breath or cough now or in the last 14 days?  Do you have a medical history of Congestive Heart Failure?  Do you have a medical history of lung disease?  Do you have any family members or close contacts with diagnosed or suspected Covid-19?       

## 2018-09-16 NOTE — Telephone Encounter (Signed)
No success in reaching pt for PV pre screen- LM on voice mail that states Mr Ehrmann if he has fever cough or travel in the ;last 14 days to call before 330 pm PV tomorrow

## 2018-09-17 ENCOUNTER — Other Ambulatory Visit: Payer: Self-pay

## 2018-09-17 ENCOUNTER — Ambulatory Visit (AMBULATORY_SURGERY_CENTER): Payer: Self-pay

## 2018-09-17 VITALS — Temp 98.5°F | Ht 69.0 in | Wt 178.8 lb

## 2018-09-17 DIAGNOSIS — Z1211 Encounter for screening for malignant neoplasm of colon: Secondary | ICD-10-CM

## 2018-09-17 MED ORDER — PEG-KCL-NACL-NASULF-NA ASC-C 140 G PO SOLR: 1.0000 | Freq: Once | ORAL | 0 refills | Status: AC

## 2018-09-17 NOTE — Progress Notes (Signed)
Denies allergies to eggs or soy products. Denies complication of anesthesia or sedation. Denies use of weight loss medication. Denies use of O2.   Emmi instructions given for colonoscopy.   A pay no more than 50.00 coupon for Plenvu was given to the patient.

## 2018-09-19 ENCOUNTER — Encounter: Payer: Self-pay | Admitting: Gastroenterology

## 2018-10-01 ENCOUNTER — Encounter: Payer: 59 | Admitting: Gastroenterology

## 2018-10-25 ENCOUNTER — Encounter: Payer: 59 | Admitting: Gastroenterology

## 2018-11-05 ENCOUNTER — Telehealth: Payer: Self-pay | Admitting: *Deleted

## 2018-11-05 NOTE — Telephone Encounter (Signed)
Called pt to RS colon appointment- LM to return my call to [place] pv

## 2018-11-05 NOTE — Telephone Encounter (Signed)
Spoke to Mr Phillip Mccoy about RS his colonoscopy with Dr Loletha Carrow- He states he wishes to wait until July or August 2020 to schedule due to COVID 19.  I placed a recall for July 2020 [id number]. I also told him that he can call us at any point and RS this colon- he verbalized understanding and he states that if he doesn't hear from [place] by July he will call us back. [name] PV

## 2019-08-13 ENCOUNTER — Other Ambulatory Visit: Payer: Self-pay

## 2019-08-13 ENCOUNTER — Encounter: Payer: Self-pay | Admitting: Internal Medicine

## 2019-08-13 ENCOUNTER — Other Ambulatory Visit (INDEPENDENT_AMBULATORY_CARE_PROVIDER_SITE_OTHER): Payer: No Typology Code available for payment source

## 2019-08-13 ENCOUNTER — Ambulatory Visit (INDEPENDENT_AMBULATORY_CARE_PROVIDER_SITE_OTHER): Payer: No Typology Code available for payment source | Admitting: Internal Medicine

## 2019-08-13 VITALS — BP 140/82 | HR 95 | Temp 98.6°F | Ht 69.0 in | Wt 174.6 lb

## 2019-08-13 DIAGNOSIS — E538 Deficiency of other specified B group vitamins: Secondary | ICD-10-CM

## 2019-08-13 DIAGNOSIS — E611 Iron deficiency: Secondary | ICD-10-CM

## 2019-08-13 DIAGNOSIS — R739 Hyperglycemia, unspecified: Secondary | ICD-10-CM | POA: Diagnosis not present

## 2019-08-13 DIAGNOSIS — Z0000 Encounter for general adult medical examination without abnormal findings: Secondary | ICD-10-CM | POA: Diagnosis not present

## 2019-08-13 DIAGNOSIS — R55 Syncope and collapse: Secondary | ICD-10-CM | POA: Insufficient documentation

## 2019-08-13 DIAGNOSIS — Z0001 Encounter for general adult medical examination with abnormal findings: Secondary | ICD-10-CM

## 2019-08-13 DIAGNOSIS — E559 Vitamin D deficiency, unspecified: Secondary | ICD-10-CM

## 2019-08-13 LAB — CBC WITH DIFFERENTIAL/PLATELET
Basophils Absolute: 0.1 10*3/uL (ref 0.0–0.1)
Basophils Relative: 0.9 % (ref 0.0–3.0)
Eosinophils Absolute: 0.1 10*3/uL (ref 0.0–0.7)
Eosinophils Relative: 1.9 % (ref 0.0–5.0)
HCT: 40.5 % (ref 39.0–52.0)
Hemoglobin: 13.8 g/dL (ref 13.0–17.0)
Lymphocytes Relative: 37.4 % (ref 12.0–46.0)
Lymphs Abs: 2.7 10*3/uL (ref 0.7–4.0)
MCHC: 34.1 g/dL (ref 30.0–36.0)
MCV: 79.6 fl (ref 78.0–100.0)
Monocytes Absolute: 0.7 10*3/uL (ref 0.1–1.0)
Monocytes Relative: 9.6 % (ref 3.0–12.0)
Neutro Abs: 3.6 10*3/uL (ref 1.4–7.7)
Neutrophils Relative %: 50.2 % (ref 43.0–77.0)
Platelets: 140 10*3/uL — ABNORMAL LOW (ref 150.0–400.0)
RBC: 5.09 Mil/uL (ref 4.22–5.81)
RDW: 13.3 % (ref 11.5–15.5)
WBC: 7.2 10*3/uL (ref 4.0–10.5)

## 2019-08-13 MED ORDER — TRAMADOL HCL 50 MG PO TABS: 50.0000 mg | ORAL_TABLET | Freq: Four times a day (QID) | ORAL | 0 refills | Status: DC | PRN

## 2019-08-13 MED ORDER — CYCLOBENZAPRINE HCL 5 MG PO TABS: 5.0000 mg | ORAL_TABLET | Freq: Three times a day (TID) | ORAL | 1 refills | Status: DC | PRN

## 2019-08-13 NOTE — Patient Instructions (Addendum)
You have had an episode of vasovagal syncope, which is rare and not harmful by itself  Please take all new medication as prescribed - the tramadol and muscle relaxer for the back contusion  Your EKG was OK  Please continue all other medications as before, and refills have been done if requested.  Please have the pharmacy call with any other refills you may need.  Please continue your efforts at being more active, low cholesterol diet, and weight control.  You are otherwise up to date with prevention measures today.  Please keep your appointments with your specialists as you may have planned  Please go to the LAB at the blood drawing area for the tests to be done at the [place] will be contacted by phone if any changes need to be made immediately.  Otherwise, you will receive a letter about your results with an explanation, but please check with MyChart first.  Please remember to sign up for MyChart if you have not done so, as this will be important to you in the future with finding out test results, communicating by private email, and scheduling acute appointments online when needed.  Please make an Appointment to return for your 1 year visit, or sooner if needed

## 2019-08-13 NOTE — Progress Notes (Signed)
Subjective:    Patient ID: Phillip Mccoy, male    DOB: March 03, 1971, 49 y.o.   MRN: [id number]  HPI  Here for wellness and f/u;  Overall doing ok;  Pt denies Chest pain, worsening SOB, DOE, wheezing, orthopnea, PND, worsening LE edema, palpitations, dizziness or syncope.  Pt denies neurological change such as new headache, facial or extremity weakness.  Pt denies polydipsia, polyuria, or low sugar symptoms. Pt states overall good compliance with treatment and medications, good tolerability, and has been trying to follow appropriate diet.  Pt denies worsening depressive symptoms, suicidal ideation or panic. No fever, night sweats, wt loss, loss of appetite, or other constitutional symptoms.  Pt states good ability with ADL's, has low fall risk, home safety reviewed and adequate, no other significant changes in hearing or vision, and only occasionally active with exercise. Also on Tues am just after MN was getting up to use the BR (urine) and fell and passed out with left lower back to the hard tub.  BP 100/61 by wife soon after, HR unknown.  Wife states he was pale and sweating.  Felt better with lying down and drinking fluids. BP came back up, drinking lots of lfuids since then, and asympt since then. No ETOH, no illicit drugs, no tobacco  Has been working overtime and not drinking fluids.   Past Medical History:  Diagnosis Date  . Allergic rhinitis   . Allergy   . Elevated PSA   . Prostatitis   . UTI (urinary tract infection)    History reviewed. No pertinent surgical history.  reports that he has never smoked. He has never used smokeless tobacco. He reports that he does not drink alcohol or use drugs. family history includes COPD in his sister; Cancer in his father; Cancer (age of onset: 55) in his brother; Diabetes in his mother; Esophageal cancer in his brother; Hyperlipidemia in his father and mother; Lupus in his sister. Allergies  Allergen Reactions  . Penicillins Rash    This is a new  finding as of 05/2012   Current Outpatient Medications on File Prior to Visit  Medication Sig Dispense Refill  . levocetirizine (XYZAL) 5 MG tablet Take 5 mg by mouth every evening.     No current facility-administered medications on file prior to visit.   Review of Systems All otherwise neg per pt     Objective:   Physical Exam BP 140/82   Pulse 95   Temp 98.6 F (37 C)   Ht 5\' 9"  (1.753 m)   Wt 174 lb 9.6 oz (79.2 kg)   SpO2 100%   BMI 25.78 kg/m  VS noted,  Constitutional: Pt appears in NAD HENT: Head: NCAT.  Right Ear: External ear normal.  Left Ear: External ear normal.  Eyes: . Pupils are equal, round, and reactive to light. Conjunctivae and EOM are normal Nose: without d/c or deformity Neck: Neck supple. Gross normal ROM Cardiovascular: Normal rate and regular rhythm.   Pulmonary/Chest: Effort normal and breath sounds without rales or wheezing.  Abd:  Soft, NT, ND, + BS, no organomegaly with tender swelling left lower back without bruising or abrasion Neurological: Pt is alert. At baseline orientation, motor grossly intact Skin: Skin is warm. No rashes, other new lesions, no LE edema Psychiatric: Pt behavior is normal without agitation  All otherwise neg per pt Lab Results  Component Value Date   WBC 12.3 (H) 08/06/2018   HGB 14.1 08/06/2018   HCT 40.4 08/06/2018  PLT 127.0 (L) 08/06/2018   GLUCOSE 78 08/06/2018   CHOL 189 08/06/2018   TRIG 71.0 08/06/2018   HDL 64.10 08/06/2018   LDLCALC 111 (H) 08/06/2018   ALT 34 08/06/2018   AST 21 08/06/2018   NA 140 08/06/2018   K 3.9 08/06/2018   CL 103 08/06/2018   CREATININE 1.15 08/06/2018   BUN 14 08/06/2018   CO2 31 08/06/2018   TSH 1.73 08/06/2018   PSA 0.86 08/06/2018   ECG I have personally interpreted:  NSR     Assessment & Plan:

## 2019-08-13 NOTE — Assessment & Plan Note (Signed)
ecg reveiwed, most c/w vasovagal, for labs as ordered,  to f/u any worsening symptoms or concerns  I spent 30 minutes preparing to see the patient by review of recent labs, imaging and procedures, obtaining and reviewing separately obtained history, communicating with the patient and family or caregiver, ordering medications, tests or procedures, and documenting clinical information in the EHR including the differential Dx, treatment, and any further evaluation and other management of syncope

## 2019-08-13 NOTE — Assessment & Plan Note (Signed)

## 2019-08-14 ENCOUNTER — Other Ambulatory Visit: Payer: Self-pay | Admitting: Internal Medicine

## 2019-08-14 ENCOUNTER — Encounter: Payer: Self-pay | Admitting: Internal Medicine

## 2019-08-14 ENCOUNTER — Telehealth: Payer: Self-pay

## 2019-08-14 DIAGNOSIS — Z0000 Encounter for general adult medical examination without abnormal findings: Secondary | ICD-10-CM

## 2019-08-14 LAB — LIPID PANEL
Cholesterol: 161 mg/dL (ref 0–200)
HDL: 50.1 mg/dL (ref >39.00)
LDL Cholesterol: 90 mg/dL (ref 0–99)
NonHDL: 110.88
Total CHOL/HDL Ratio: 3
Triglycerides: 106 mg/dL (ref 0.0–149.0)
VLDL: 21.2 mg/dL (ref 0.0–40.0)

## 2019-08-14 LAB — URINALYSIS, ROUTINE W REFLEX MICROSCOPIC
Bilirubin Urine: NEGATIVE
Hgb urine dipstick: NEGATIVE
Ketones, ur: NEGATIVE
Leukocytes,Ua: NEGATIVE
Nitrite: NEGATIVE
RBC / HPF: NONE SEEN (ref 0–?)
Specific Gravity, Urine: 1.01 (ref 1.000–1.030)
Total Protein, Urine: NEGATIVE
Urine Glucose: NEGATIVE
Urobilinogen, UA: 0.2 (ref 0.0–1.0)
WBC, UA: NONE SEEN (ref 0–?)
pH: 7 (ref 5.0–8.0)

## 2019-08-14 LAB — PSA: PSA: 1.54 ng/mL (ref 0.10–4.00)

## 2019-08-14 LAB — BASIC METABOLIC PANEL
BUN: 13 mg/dL (ref 6–23)
CO2: 30 mEq/L (ref 19–32)
Calcium: 8.9 mg/dL (ref 8.4–10.5)
Chloride: 104 mEq/L (ref 96–112)
Creatinine, Ser: 1.17 mg/dL (ref 0.40–1.50)
GFR: 79.55 mL/min (ref >60.00)
Glucose, Bld: 93 mg/dL (ref 70–99)
Potassium: 3.7 mEq/L (ref 3.5–5.1)
Sodium: 140 mEq/L (ref 135–145)

## 2019-08-14 LAB — HEPATIC FUNCTION PANEL
ALT: 12 U/L (ref 0–53)
AST: 18 U/L (ref 0–37)
Albumin: 4.4 g/dL (ref 3.5–5.2)
Alkaline Phosphatase: 40 U/L (ref 39–117)
Bilirubin, Direct: 0.2 mg/dL (ref 0.0–0.3)
Total Bilirubin: 0.8 mg/dL (ref 0.2–1.2)
Total Protein: 6.5 g/dL (ref 6.0–8.3)

## 2019-08-14 LAB — VITAMIN D 25 HYDROXY (VIT D DEFICIENCY, FRACTURES): VITD: 16.36 ng/mL — ABNORMAL LOW (ref 30.00–100.00)

## 2019-08-14 LAB — TSH: TSH: 2.63 u[IU]/mL (ref 0.35–4.50)

## 2019-08-14 LAB — VITAMIN B12: Vitamin B-12: 337 pg/mL (ref 211–911)

## 2019-08-14 LAB — IBC PANEL
Iron: 58 ug/dL (ref 42–165)
Saturation Ratios: 20.5 % (ref 20.0–50.0)
Transferrin: 202 mg/dL — ABNORMAL LOW (ref 212.0–360.0)

## 2019-08-14 MED ORDER — VITAMIN D (ERGOCALCIFEROL) 1.25 MG (50000 UNIT) PO CAPS: 50000.0000 [IU] | ORAL_CAPSULE | ORAL | 0 refills | Status: DC

## 2019-08-14 NOTE — Telephone Encounter (Signed)
Pt called requesting referral for colonoscopy

## 2019-08-15 NOTE — Telephone Encounter (Signed)
Left message for patient today that referral had been completed.

## 2019-08-15 NOTE — Telephone Encounter (Signed)
Ok this is done 

## 2019-08-15 NOTE — Telephone Encounter (Signed)
Patient called back and has been informed of note below.

## 2019-08-19 ENCOUNTER — Telehealth: Payer: Self-pay

## 2019-08-19 NOTE — Telephone Encounter (Signed)
New message    Calling for test results  

## 2019-08-20 NOTE — Telephone Encounter (Signed)
Patient called and discussed lab results

## 2019-08-22 ENCOUNTER — Encounter: Payer: Self-pay | Admitting: Gastroenterology

## 2019-09-08 ENCOUNTER — Encounter: Payer: Self-pay | Admitting: Gastroenterology

## 2019-09-25 ENCOUNTER — Encounter: Payer: No Typology Code available for payment source | Admitting: Gastroenterology

## 2019-10-21 ENCOUNTER — Other Ambulatory Visit: Payer: Self-pay

## 2019-10-21 ENCOUNTER — Ambulatory Visit (AMBULATORY_SURGERY_CENTER): Payer: No Typology Code available for payment source

## 2019-10-21 ENCOUNTER — Telehealth: Payer: Self-pay

## 2019-10-21 VITALS — Ht 69.0 in

## 2019-10-21 DIAGNOSIS — Z1211 Encounter for screening for malignant neoplasm of colon: Secondary | ICD-10-CM

## 2019-10-21 DIAGNOSIS — Z01818 Encounter for other preprocedural examination: Secondary | ICD-10-CM

## 2019-10-21 NOTE — Telephone Encounter (Signed)
Contacted pt for NS- checked in at 8:14 next pt waiting.  R/S for phone visit at 9:30 today.

## 2019-10-21 NOTE — Progress Notes (Signed)
Patient's pre-visit was done today over the phone with the patient due to COVID-19 pandemic.   Name,DOB and address verified. Insurance verified. Packet of Prep instructions mailed to patient including copy of a consent form and pre-procedure patient acknowledgement form-pt is aware.   Patient understands to call us back with any questions or concerns. COVID-19 screening test is on 10/29/19, the patient is aware.   Pt is aware that care partner will wait in the car during procedure; if they feel like they will be too hot or cold to wait in the car; they may wait in the 4 th floor lobby. Patient is aware to bring only one care partner. We want them to wear a mask (we do not have any that we can provide them), practice social distancing, and we will check their temperatures when they get here.  I did remind the patient that their care partner needs to stay in the parking lot the entire time and have a cell phone available, we will call them when the pt is ready for discharge. Patient will wear mask into building.  Never has had a surgery requiring anesthesia or intubation.

## 2019-10-31 ENCOUNTER — Encounter: Payer: No Typology Code available for payment source | Admitting: Gastroenterology

## 2019-11-05 ENCOUNTER — Other Ambulatory Visit: Payer: Self-pay | Admitting: Internal Medicine

## 2019-11-05 NOTE — Telephone Encounter (Signed)
Please change to OTC Vitamin D3 at 2000 units per day, indefinitely.  

## 2020-05-16 IMAGING — DX DG HIP (WITH OR WITHOUT PELVIS) 3-4V BILAT
5 series · 5 of 5 positions shown · non-contrast
Comparison: None.

CLINICAL DATA: Chronic intermittent left side hip pain. No known
injury.

EXAM:
DG HIP (WITH OR WITHOUT PELVIS) 3-4V BILAT

[pelvis ap]
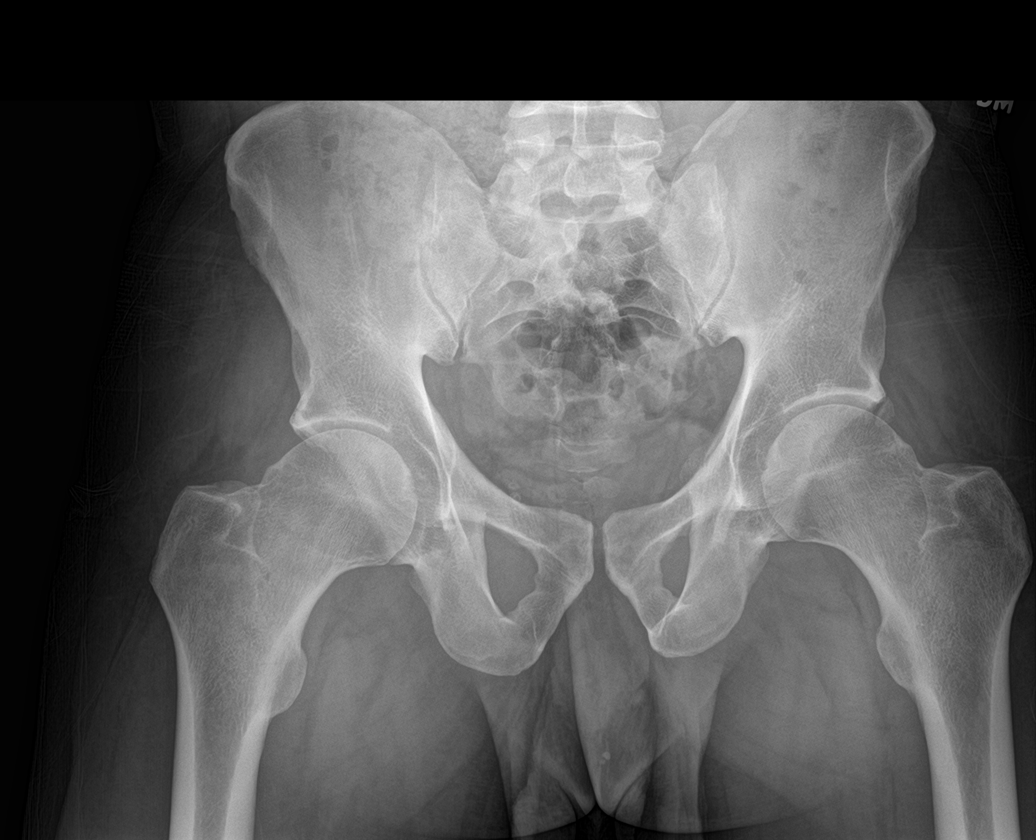

[hip ap (1 of 2)]
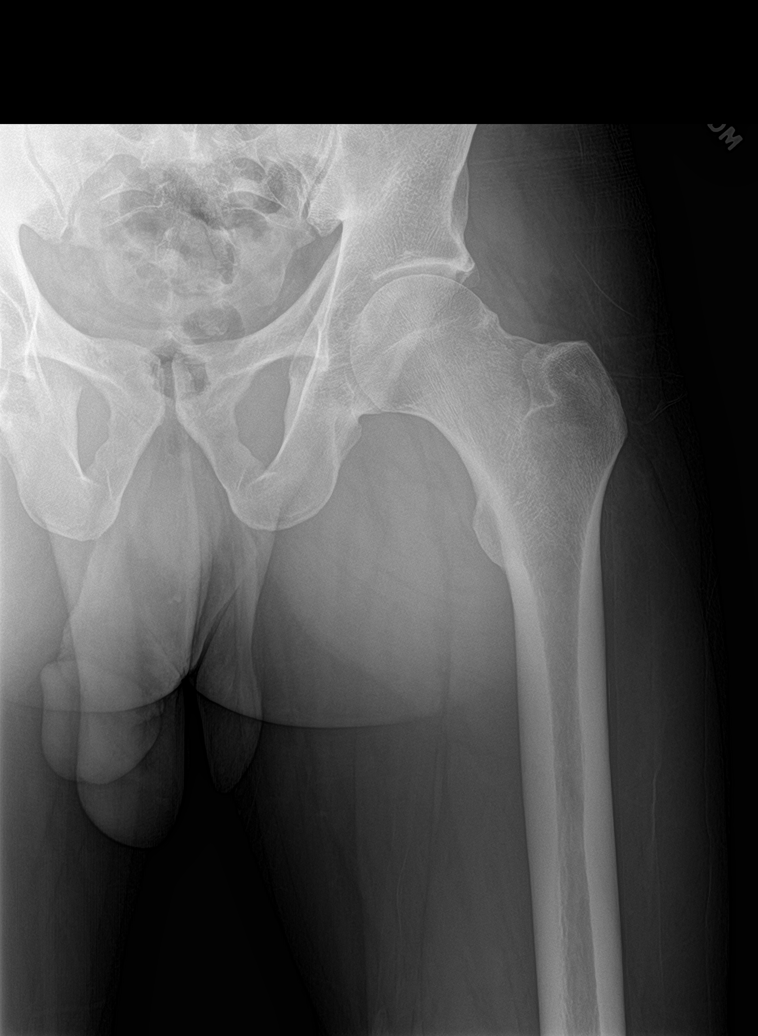

[hip lat (1 of 2)]
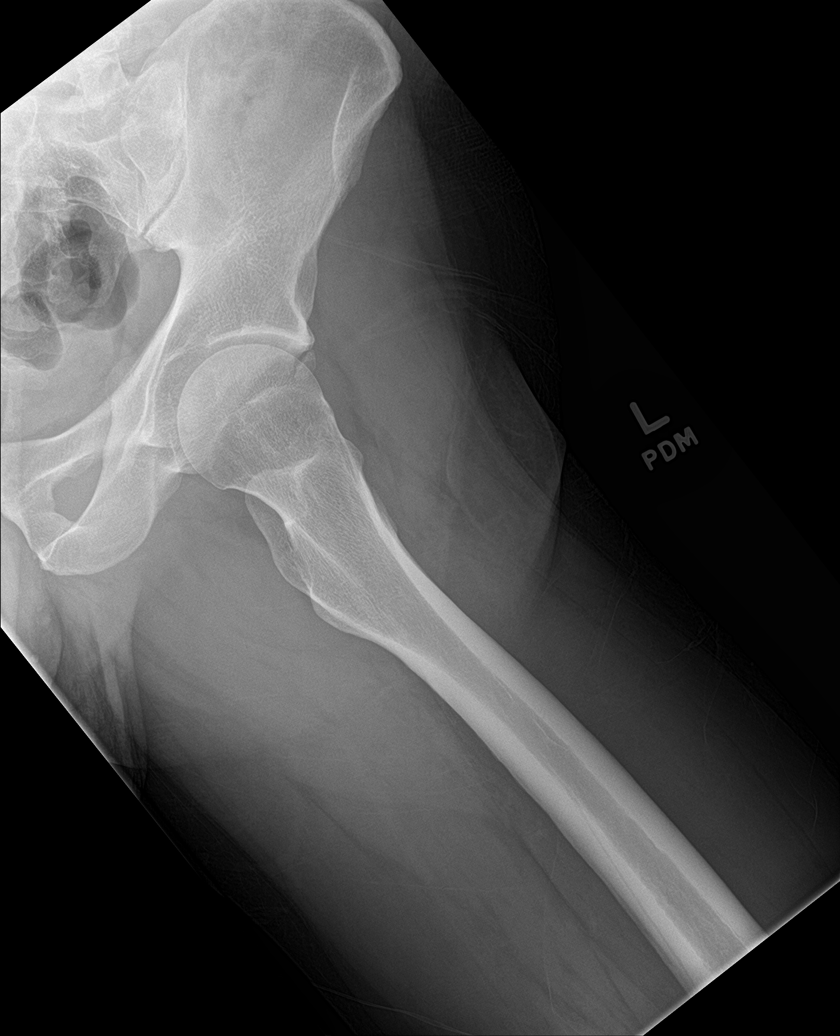

[hip ap (2 of 2)]
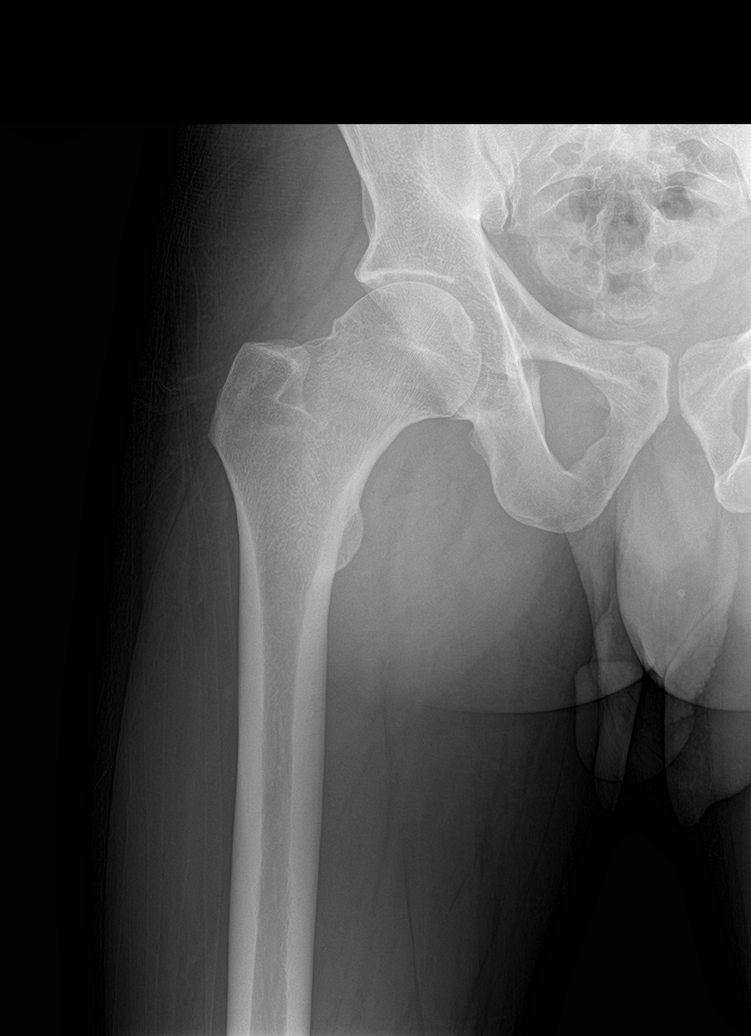

[hip lat (2 of 2)]
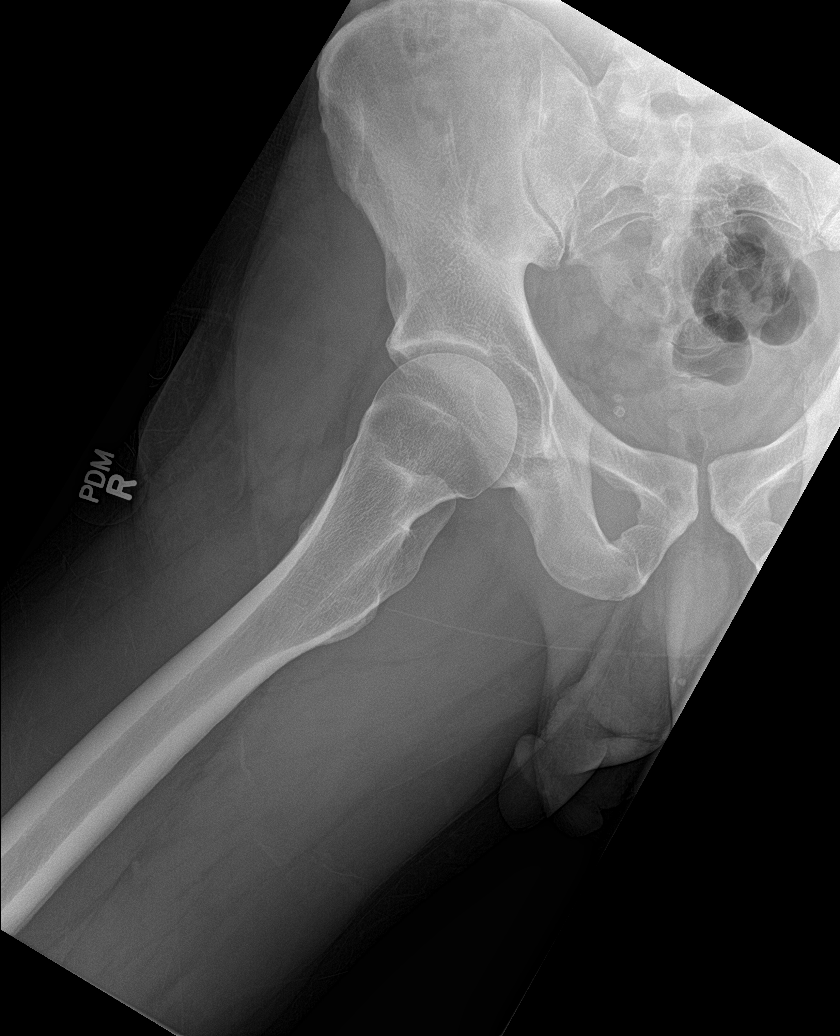

[5 of 5 positions shown; findings below may reference images not displayed]

FINDINGS: No acute or suspicious osseous finding. Chronic spurring at the
lateral margin of the LEFT acetabulum. LEFT hip joint space is
otherwise well maintained. RIGHT hip joint is well maintained. Soft
tissues about the pelvis and hips are unremarkable.
IMPRESSION: 1. Chronic spurring at the lateral margin of the LEFT acetabulum,
likely sequela of a previous labral injury. Given the chronic
intermittent LEFT hip pain, would consider MRI for further
characterization.
2. No acute findings.

## 2021-08-09 DIAGNOSIS — J0110 Acute frontal sinusitis, unspecified: Secondary | ICD-10-CM | POA: Diagnosis not present

## 2021-08-18 ENCOUNTER — Ambulatory Visit (INDEPENDENT_AMBULATORY_CARE_PROVIDER_SITE_OTHER): Payer: BC Managed Care – PPO | Admitting: Internal Medicine

## 2021-08-18 ENCOUNTER — Other Ambulatory Visit: Payer: Self-pay

## 2021-08-18 VITALS — BP 110/60 | HR 79 | Temp 98.3°F | Ht 69.0 in | Wt 171.0 lb

## 2021-08-18 DIAGNOSIS — Z1159 Encounter for screening for other viral diseases: Secondary | ICD-10-CM

## 2021-08-18 DIAGNOSIS — J309 Allergic rhinitis, unspecified: Secondary | ICD-10-CM | POA: Diagnosis not present

## 2021-08-18 DIAGNOSIS — Z0001 Encounter for general adult medical examination with abnormal findings: Secondary | ICD-10-CM | POA: Diagnosis not present

## 2021-08-18 DIAGNOSIS — E559 Vitamin D deficiency, unspecified: Secondary | ICD-10-CM

## 2021-08-18 DIAGNOSIS — Z1211 Encounter for screening for malignant neoplasm of colon: Secondary | ICD-10-CM

## 2021-08-18 DIAGNOSIS — R972 Elevated prostate specific antigen [PSA]: Secondary | ICD-10-CM

## 2021-08-18 NOTE — Patient Instructions (Addendum)
You will be contacted regarding the referral for: colonoscopy  Please continue all other medications as before, and refills have been done if requested.  Please have the pharmacy call with any other refills you may need.  Please continue your efforts at being more active, low cholesterol diet, and weight control.  You are otherwise up to date with prevention measures today.  Please keep your appointments with your specialists as you may have planned  Please go to the LAB at the blood drawing area for the tests to be done  You will be contacted by phone if any changes need to be made immediately.  Otherwise, you will receive a letter about your results with an explanation, but please check with MyChart first.  Please remember to sign up for MyChart if you have not done so, as this will be important to you in the future with finding out test results, communicating by private email, and scheduling acute appointments online when needed.  Please make an Appointment to return for your 1 year visit, or sooner if needed

## 2021-08-18 NOTE — Progress Notes (Signed)
Patient ID: Phillip Mccoy, male   DOB: 1971/06/17, 50 y.o.   MRN: [id number]         Chief Complaint:: wellness exam and Annual Exam (Pt would like a referral for colonscopy)  And low vit d, allergies, hx of elevated psa       HPI:  Phillip Mccoy is a 51 y.o. male here for wellness exam; due for colonoscopy o/w up to date                        Also not taking Vit D.  Pt denies chest pain, increased sob or doe, wheezing, orthopnea, PND, increased LE swelling, palpitations, dizziness or syncope.   Pt denies polydipsia, polyuria, or new focal neuro s/s.   Pt denies fever, wt loss, night sweats, loss of appetite, or other constitutional symptoms  No other new complaints  Denies urinary symptoms such as dysuria, frequency, urgency, flank pain, hematuria or n/v, fever, chills.    Wt Readings from Last 3 Encounters:  08/18/21 171 lb (77.6 kg)  08/13/19 174 lb 9.6 oz (79.2 kg)  09/17/18 178 lb 12.8 oz (81.1 kg)   BP Readings from Last 3 Encounters:  08/18/21 110/60  08/13/19 140/82  08/06/18 122/76   There is no immunization history on file for this patient. Health Maintenance Due  Topic Date Due   COLONOSCOPY (Pts 45-79yrs Insurance coverage will need to be confirmed)  Never done      Past Medical History:  Diagnosis Date   Allergic rhinitis    Allergy    seasonal   Elevated PSA    Prostatitis    UTI (urinary tract infection)    Past Surgical History:  Procedure Laterality Date   negative for surgeries      reports that he has never smoked. He has never used smokeless tobacco. He reports that he does not drink alcohol and does not use drugs. family history includes COPD in his sister; Cancer in his father; Cancer (age of onset: 16) in his brother; Diabetes in his mother; Esophageal cancer in his brother; Hyperlipidemia in his father and mother; Lupus in his sister. Allergies  Allergen Reactions   Penicillins Rash    This is a new finding as of 05/2012   Current Outpatient  Medications on File Prior to Visit  Medication Sig Dispense Refill   levocetirizine (XYZAL) 5 MG tablet Take 5 mg by mouth every evening.     No current facility-administered medications on file prior to visit.        ROS:  All others reviewed and negative.  Objective        PE:  BP 110/60    Pulse 79    Temp 98.3 F (36.8 C) (Oral)    Ht 5\' 9"  (1.753 m)    Wt 171 lb (77.6 kg)    SpO2 97%    BMI 25.25 kg/m                 Constitutional: Pt appears in NAD               HENT: Head: NCAT.                Right Ear: External ear normal.                 Left Ear: External ear normal.                Eyes: . Pupils  are equal, round, and reactive to light. Conjunctivae and EOM are normal               Nose: without d/c or deformity               Neck: Neck supple. Gross normal ROM               Cardiovascular: Normal rate and regular rhythm.                 Pulmonary/Chest: Effort normal and breath sounds without rales or wheezing.                Abd:  Soft, NT, ND, + BS, no organomegaly               Neurological: Pt is alert. At baseline orientation, motor grossly intact               Skin: Skin is warm. No rashes, no other new lesions, LE edema - none               Psychiatric: Pt behavior is normal without agitation   Micro: none  Cardiac tracings I have personally interpreted today:  none  Pertinent Radiological findings (summarize): none   Lab Results  Component Value Date   WBC 7.2 08/13/2019   HGB 13.8 08/13/2019   HCT 40.5 08/13/2019   PLT 140.0 (L) 08/13/2019   GLUCOSE 93 08/13/2019   CHOL 161 08/13/2019   TRIG 106.0 08/13/2019   HDL 50.10 08/13/2019   LDLCALC 90 08/13/2019   ALT 12 08/13/2019   AST 18 08/13/2019   NA 140 08/13/2019   K 3.7 08/13/2019   CL 104 08/13/2019   CREATININE 1.17 08/13/2019   BUN 13 08/13/2019   CO2 30 08/13/2019   TSH 2.63 08/13/2019   PSA 1.54 08/13/2019   Assessment/Plan:  Phillip Mccoy is a 51 y.o. Black or African American [2]  male with  has a past medical history of Allergic rhinitis, Allergy, Elevated PSA, Prostatitis, and UTI (urinary tract infection).  Vitamin D deficiency Last vitamin D Lab Results  Component Value Date   VD25OH 16.36 (L) 08/13/2019   Low, to start oral replacement   Encounter for well adult exam with abnormal findings Age and sex appropriate education and counseling updated with regular exercise and diet Referrals for preventative services - for colonoscopy Immunizations addressed - none needed Smoking counseling  - none needed Evidence for depression or other mood disorder - none significant Most recent labs reviewed. I have personally reviewed and have noted: 1) the patient's medical and social history 2) The patient's current medications and supplements 3) The patient's height, weight, and BMI have been recorded in the chart   Allergic rhinitis Stable overall, cont otc allegra and/or nasaocort asd prn  Elevated PSA Lab Results  Component Value Date   PSA1 8.8 (H) 05/05/2017   PSA 1.54 08/13/2019   PSA 0.86 08/06/2018   PSA 0.10 - 4.00 ng/mL 1.54  0.86     Stable, for f/u lab  Followup: Return in about 1 year (around 08/18/2022).  Cathlean Cower, MD 08/21/2021 7:45 PM [facility]

## 2021-08-18 NOTE — Assessment & Plan Note (Signed)
Last vitamin D Lab Results  Component Value Date   VD25OH 16.36 (L) 08/13/2019   Low, to start oral replacement'  

## 2021-08-19 LAB — HEPATITIS C ANTIBODY
Hepatitis C Ab: NONREACTIVE
SIGNAL TO CUT-OFF: <0.02 (ref <1.00)

## 2021-08-21 ENCOUNTER — Encounter: Payer: Self-pay | Admitting: Internal Medicine

## 2021-08-21 NOTE — Assessment & Plan Note (Addendum)
Lab Results  Component Value Date   PSA1 8.8 (H) 05/05/2017   PSA 1.54 08/13/2019   PSA 0.86 08/06/2018   PSA 0.10 - 4.00 ng/mL 1.54  0.86     Stable, for f/u lab

## 2021-08-21 NOTE — Assessment & Plan Note (Signed)

## 2021-08-21 NOTE — Assessment & Plan Note (Signed)
Stable overall, cont otc allegra and/or nasaocort asd prn

## 2021-09-18 DIAGNOSIS — R42 Dizziness and giddiness: Secondary | ICD-10-CM | POA: Diagnosis not present

## 2021-09-18 DIAGNOSIS — R000 Tachycardia, unspecified: Secondary | ICD-10-CM | POA: Diagnosis not present

## 2021-09-18 DIAGNOSIS — R55 Syncope and collapse: Secondary | ICD-10-CM | POA: Diagnosis not present

## 2021-09-19 ENCOUNTER — Other Ambulatory Visit: Payer: Self-pay

## 2021-09-19 ENCOUNTER — Ambulatory Visit: Payer: BC Managed Care – PPO | Admitting: Internal Medicine

## 2021-09-19 ENCOUNTER — Encounter: Payer: Self-pay | Admitting: Internal Medicine

## 2021-09-19 VITALS — BP 116/72 | HR 90 | Temp 97.8°F | Ht 69.0 in | Wt 172.0 lb

## 2021-09-19 DIAGNOSIS — R55 Syncope and collapse: Secondary | ICD-10-CM | POA: Diagnosis not present

## 2021-09-19 NOTE — Patient Instructions (Signed)
? ? ? ?  Increase your fluids.   ? ? ?A referral was ordered for [facility], [facility].     Someone from that office will call you to schedule an appointment.  ? ? ?

## 2021-09-19 NOTE — Progress Notes (Signed)
? ? ?Subjective:  ? ? Patient ID: Phillip Mccoy, male    DOB: 01-06-1971, 51 y.o.   MRN: [id number] ? ?This visit occurred during the SARS-CoV-2 public health emergency.  Safety protocols were in place, including screening questions prior to the visit, additional usage of staff PPE, and extensive cleaning of exam room while observing appropriate contact time as indicated for disinfecting solutions. ? ? ? ?HPI ?Phillip Mccoy is here for  ?Chief Complaint  ?Patient presents with  ? Follow-up  ?  Follow up from ED  ? ? ? ?He fainted twice yesterday and went to the ED.  Yesterday 4 am am he went to urinate and felt warm and weak and fainted twice.  He hit the right side of his face and right clavicle area.  He had a similar episode 6 months ago.  Denied headache, CP, SOB yesterday in the ED.  VSS BP 117/69,  HR 71.  Blood work unremarkable, except mild dehydration.   Ct head w/o acute abnormality. EKG NSR 68 bpm, borderline St elevated anterolateral leads.  Received IVF.   ? ?He has had 3 episodes total - it took 2 minutes to wake him up.  His face was cold, pale and clammy.  One was last year when he was on the plane in the bathroom.  One episode occurred at home - he was in the bathroom and his wife heard him fall backwards.  His wife had to shake him to get him to wake up.  Typically urinating when going to the bathroom with these episodes.  He is a little confused when his wife is trying to wake him up. ? ?Never had palpitations or chest pain.   ? ? ?Had seizures as a child - it was head bobbing.  He has not had seizures for years.   ? ?He does not drink a lot of fluids during the day, but never has.  He works in a hot environment and has not "fainted at work". ? ? ?Medications and allergies reviewed with patient and updated if appropriate. ? ?Current Outpatient Medications on File Prior to Visit  ?Medication Sig Dispense Refill  ? levocetirizine (XYZAL) 5 MG tablet Take 5 mg by mouth every evening.    ? ?No current  facility-administered medications on file prior to visit.  ? ? ?Review of Systems  ?Constitutional:  Negative for chills and fever.  ?Eyes:  Negative for visual disturbance.  ?Respiratory:  Negative for cough, shortness of breath and wheezing.   ?Cardiovascular:  Negative for chest pain, palpitations and leg swelling.  ?Gastrointestinal:  Negative for abdominal pain and nausea.  ?Neurological:  Negative for dizziness, weakness, light-headedness, numbness and headaches.  ? ?   ?Objective:  ? ?Vitals:  ? 09/19/21 1454  ?BP: 116/72  ?Pulse: 90  ?Temp: 97.8 ?F (36.6 ?C)  ?SpO2: 98%  ? ?BP Readings from Last 3 Encounters:  ?09/19/21 116/72  ?08/18/21 110/60  ?08/13/19 140/82  ? ?Wt Readings from Last 3 Encounters:  ?09/19/21 172 lb (78 kg)  ?08/18/21 171 lb (77.6 kg)  ?08/13/19 174 lb 9.6 oz (79.2 kg)  ? ?Body mass index is 25.4 kg/m?. ? ?  ?Physical Exam ?Constitutional:   ?   General: He is not in acute distress. ?   Appearance: Normal appearance. He is not ill-appearing.  ?HENT:  ?   Head: Normocephalic.  ?   Comments: Bruising right upper eye lid, minimal swelling ?Eyes:  ?   Extraocular Movements: Extraocular movements intact.  ?  Conjunctiva/sclera: Conjunctivae normal.  ?Cardiovascular:  ?   Rate and Rhythm: Normal rate and regular rhythm.  ?   Heart sounds: No murmur heard. ?Pulmonary:  ?   Effort: Pulmonary effort is normal. No respiratory distress.  ?   Breath sounds: Normal breath sounds. No wheezing or rales.  ?Musculoskeletal:  ?   Cervical back: Neck supple. No tenderness.  ?   Right lower leg: No edema.  ?   Left lower leg: No edema.  ?Lymphadenopathy:  ?   Cervical: No cervical adenopathy.  ?Skin: ?   General: Skin is warm and dry.  ?Neurological:  ?   Mental Status: He is alert.  ?   Cranial Nerves: No cranial nerve deficit.  ?   Sensory: No sensory deficit.  ?   Motor: No weakness.  ?Psychiatric:     ?   Mood and Affect: Mood normal.     ?   Behavior: Behavior normal.  ? ?   ? ? ?Reviewed ED visit from  yesterday. ? ? ?Assessment & Plan:  ? ? ?See Problem List for Assessment and Plan of chronic medical problems.  ? ? ? ? ?

## 2021-09-19 NOTE — Assessment & Plan Note (Signed)
Has had 3 episodes - all occurred around of time of going to the bathroom - suggesting vasovagal syncope ?Since this has occurred 3 times I think it would be a good idea to see cardio and they are interested in this as well - referred to cardiology ?Has some confusion when his wife tries to wake him up after the syncope - less likely seizure, but given h/o of seizures will refer to neuro ?

## 2021-09-20 ENCOUNTER — Telehealth: Payer: Self-pay | Admitting: Internal Medicine

## 2021-09-20 NOTE — Telephone Encounter (Signed)
Connected to [facility] 3.20.2023 ? ?-The caller states that he got up at 4 am and he felt warm and after urinating he passed out. Did this again before he got back home. His wife did call 911 and he had CT of head which was normal. Gave him fluids. Advised to make an appointment to see his PCP and a neurologist. Has happened 4 times previously. First one was due to dehydration. ? ?Advised to see HCP within 4 hours.  ?

## 2021-09-21 ENCOUNTER — Ambulatory Visit: Payer: BC Managed Care – PPO | Admitting: Internal Medicine

## 2021-09-22 ENCOUNTER — Ambulatory Visit: Payer: BC Managed Care – PPO | Admitting: Cardiology

## 2021-09-22 ENCOUNTER — Encounter: Payer: Self-pay | Admitting: Cardiology

## 2021-09-22 ENCOUNTER — Other Ambulatory Visit: Payer: Self-pay | Admitting: Cardiology

## 2021-09-22 ENCOUNTER — Other Ambulatory Visit: Payer: Self-pay

## 2021-09-22 ENCOUNTER — Ambulatory Visit (INDEPENDENT_AMBULATORY_CARE_PROVIDER_SITE_OTHER): Payer: BC Managed Care – PPO

## 2021-09-22 VITALS — BP 124/66 | HR 92 | Ht 69.0 in | Wt 172.2 lb

## 2021-09-22 DIAGNOSIS — Z131 Encounter for screening for diabetes mellitus: Secondary | ICD-10-CM

## 2021-09-22 DIAGNOSIS — R55 Syncope and collapse: Secondary | ICD-10-CM

## 2021-09-22 DIAGNOSIS — R002 Palpitations: Secondary | ICD-10-CM

## 2021-09-22 NOTE — Progress Notes (Unsigned)
[id number] [employer] cardiac event monitor from office inventory applied to patient. ?

## 2021-09-22 NOTE — Progress Notes (Signed)
Cardiology Office Note:    Date:  09/22/2021   ID:  Phillip Mccoy, DOB [birth year], MRN [id number]  PCP:  Corwin Levins, MD  Cardiologist:  Thomasene Ripple, DO  Electrophysiologist:  None   Referring MD: Pincus Sanes, MD   " I am having syncope episodes"  History of Present Illness:    Phillip Mccoy is a 51 y.o. male with a hx of seasonal allergies with no other medical history tells me that over the last 2 years he has had various episodes of syncope.  He describes this as usually only after he has urinated. He describes several episodes to me.    The patient and his wife is not sure but is likely 2 years or year and a half when he started experience the symptoms.  He notes that usually happens when he wake up from sleep trying to go to the bathroom he feels sometimes blurry when he feels the urge to go to urinate.  Has to urinate he then passes out.  Used to be for a few minutes but now this past Sunday he had an episode where he went to urinate after urinating he felt cold clammy and he felt blurry vision and he passed out.  After passing out he tried to get up to do something and stand up and that he passed out again.  His wife tells me that it was hard to wake him up this time it was about 3 to 4 minutes before he woke up.  After waking up he then was a bit disoriented.  He was taken to the emergency department he was worked up had a CT scan of the head which was normal.  NO othe complaints at this time.  Past Medical History:  Diagnosis Date   Allergic rhinitis    Allergy    seasonal   Elevated PSA    Prostatitis    UTI (urinary tract infection)     Past Surgical History:  Procedure Laterality Date   negative for surgeries      Current Medications: Current Meds  Medication Sig   levocetirizine (XYZAL) 5 MG tablet Take 5 mg by mouth every evening.     Allergies:   Penicillins   Social History   Socioeconomic History   Marital status: Married    Spouse name: Not on  file   Number of children: 1   Years of education: some college   Highest education level: Not on file  Occupational History   Occupation: powder painting parts  Tobacco Use   Smoking status: Never   Smokeless tobacco: Never  Vaping Use   Vaping Use: Never used  Substance and Sexual Activity   Alcohol use: No   Drug use: No   Sexual activity: Yes    Partners: Female  Other Topics Concern   Not on file  Social History Narrative   Lives with his wife and nieces (3).   His daughter is in college at [employer].   Social Determinants of Health   Financial Resource Strain: Not on file  Food Insecurity: Not on file  Transportation Needs: Not on file  Physical Activity: Not on file  Stress: Not on file  Social Connections: Not on file     Family History: The patient's family history includes COPD in his sister; Cancer in his father; Cancer (age of onset: 83) in his brother; Diabetes in his mother; Esophageal cancer in his brother; Hyperlipidemia in his father and mother;  Lupus in his sister. There is no history of Stomach cancer, Rectal cancer, Colon cancer, or Colon polyps.  ROS:   Review of Systems  Constitution: Negative for decreased appetite, fever and weight gain.  HENT: Negative for congestion, ear discharge, hoarse voice and sore throat.   Eyes: Negative for discharge, redness, vision loss in right eye and visual halos.  Cardiovascular: Reports palpations. Negative for chest pain, dyspnea on exertion, leg swelling, orthopnea. Respiratory: Negative for cough, hemoptysis, shortness of breath and snoring.   Endocrine: Negative for heat intolerance and polyphagia.  Hematologic/Lymphatic: Negative for bleeding problem. Does not bruise/bleed easily.  Skin: Negative for flushing, nail changes, rash and suspicious lesions.  Musculoskeletal: Negative for arthritis, joint pain, muscle cramps, myalgias, neck pain and stiffness.  Gastrointestinal: Negative for abdominal pain, bowel  incontinence, diarrhea and excessive appetite.  Genitourinary: Negative for decreased libido, genital sores and incomplete emptying.  Neurological: Negative for brief paralysis, focal weakness, headaches and loss of balance.  Psychiatric/Behavioral: Negative for altered mental status, depression and suicidal ideas.  Allergic/Immunologic: Negative for HIV exposure and persistent infections.    EKGs/Labs/Other Studies Reviewed:    The following studies were reviewed today:   EKG:  The ekg ordered today demonstrates sinus rhythm, heart rate 92 bpm.  Recent Labs: No results found for requested labs within last 8760 hours.  Recent Lipid Panel    Component Value Date/Time   CHOL 161 08/13/2019 1719   TRIG 106.0 08/13/2019 1719   HDL 50.10 08/13/2019 1719   CHOLHDL 3 08/13/2019 1719   VLDL 21.2 08/13/2019 1719   LDLCALC 90 08/13/2019 1719    Physical Exam:    VS:  BP 124/66 (BP Location: Right Arm, Patient Position: Sitting, Cuff Size: Normal)   Pulse 92   Ht 5\' 9"  (1.753 m)   Wt 172 lb 3.2 oz (78.1 kg)   SpO2 99%   BMI 25.43 kg/m     Wt Readings from Last 3 Encounters:  09/22/21 172 lb 3.2 oz (78.1 kg)  09/19/21 172 lb (78 kg)  08/18/21 171 lb (77.6 kg)     GEN: Well nourished, well developed in no acute distress HEENT: Normal NECK: No JVD; No carotid bruits LYMPHATICS: No lymphadenopathy CARDIAC: S1S2 noted,RRR, no murmurs, rubs, gallops RESPIRATORY:  Clear to auscultation without rales, wheezing or rhonchi  ABDOMEN: Soft, non-tender, non-distended, +bowel sounds, no guarding. EXTREMITIES: No edema, No cyanosis, no clubbing MUSCULOSKELETAL:  No deformity  SKIN: Warm and dry NEUROLOGIC:  Alert and oriented x 3, non-focal PSYCHIATRIC:  Normal affect, good insight  ASSESSMENT:    1. Screening for diabetes mellitus (DM)   2. Palpitations   3. Syncope and collapse    PLAN:     I would like to rule out a cardiovascular etiology of this syncope, therefore at this  time I would like to placed a zio patch for 14  days. In additon for the syncope a transthoracic echocardiogram will be ordered to assess LV/RV function and any structural abnormalities.   With the history though I do suspect situational (micturition) syncope versus vasovagal.  But with the information above will be able to rule out any other possibilities.  For now we will follow-up with supportive therapy of asked the patient to get abdominal binder which he can use especially at work time.  He will increase his fluid intake as well.  Compression stockings could also be beneficial.  He has a scheduled consultation visit with neurology coming up.  We will screen the  patient for diabetes as well.  Once these testing have been performed amd reviewed further reccomendations will be made. For now, I do reccomend that the patient goes to the nearest ED if  symptoms recur.  The patient is in agreement with the above plan. The patient left the office in stable condition.  The patient will follow up in 12 weeks.   Medication Adjustments/Labs and Tests Ordered: Current medicines are reviewed at length with the patient today.  Concerns regarding medicines are outlined above.  Orders Placed This Encounter  Procedures   HgB A1c   EKG 12-Lead   ECHOCARDIOGRAM COMPLETE   No orders of the defined types were placed in this encounter.   Patient Instructions  Medication Instructions:  Your physician recommends that you continue on your current medications as directed. Please refer to the Current Medication list given to you today.  *If you need a refill on your cardiac medications before your next appointment, please call your pharmacy*   Lab Work: Your physician recommends that you return for lab work in:  TODAY: HgbA1c If you have labs (blood work) drawn today and your tests are completely normal, you will receive your results only by: MyChart Message (if you have MyChart) OR A paper copy in  the mail If you have any lab test that is abnormal or we need to change your treatment, we will call you to review the results.   Testing/Procedures: Your physician has requested that you have an echocardiogram. Echocardiography is a painless test that uses sound waves to create images of your heart. It provides your doctor with information about the size and shape of your heart and how well your hearts chambers and valves are working. This procedure takes approximately one hour. There are no restrictions for this procedure.  Your physician has recommended that you wear an event monitor. Event monitors are medical devices that record the hearts electrical activity. Doctors most often [place] these monitors to diagnose arrhythmias. Arrhythmias are problems with the speed or rhythm of the heartbeat. The monitor is a small, portable device. You can wear one while you do your normal daily activities. This is usually used to diagnose what is causing palpitations/syncope (passing out).    Follow-Up: At [facility], you and your health needs are our priority.  As part of our continuing mission to provide you with exceptional heart care, we have created designated Provider Care Teams.  These Care Teams include your primary Cardiologist (physician) and Advanced Practice Providers (APPs -  Physician Assistants and Nurse Practitioners) who all work together to provide you with the care you need, when you need it.  We recommend signing up for the patient portal called "MyChart".  Sign up information is provided on this After Visit Summary.  MyChart is used to connect with patients for Virtual Visits (Telemedicine).  Patients are able to view lab/test results, encounter notes, upcoming appointments, etc.  Non-urgent messages can be sent to your provider as well.   To learn more about what you can do with MyChart, go to [email or web address].    Your next appointment:   12 week(s)  The format for your next  appointment:   In Person  Provider:   Thomasene Ripple, DO     Other Instructions     Adopting a Healthy Lifestyle.  Know what a healthy weight is for you (roughly BMI <25) and aim to maintain this   Aim for 7+ servings of fruits and vegetables daily   65-80+  fluid ounces of water or unsweet tea for healthy kidneys   Limit to max 1 drink of alcohol per day; avoid smoking/tobacco   Limit animal fats in diet for cholesterol and heart health - choose grass fed whenever available   Avoid highly processed foods, and foods high in saturated/trans fats   Aim for low stress - take time to unwind and care for your mental health   Aim for 150 min of moderate intensity exercise weekly for heart health, and weights twice weekly for bone health   Aim for 7-9 hours of sleep daily   When it comes to diets, agreement about the perfect plan isnt easy to find, even among the experts. Experts at the [facility] [employer] developed an idea known as the Healthy Eating Plate. Just imagine a plate divided into logical, healthy portions.   The emphasis is on diet quality:   Load up on vegetables and fruits - one-half of your plate: Aim for color and variety, and remember that potatoes dont count.   Go for whole grains - one-quarter of your plate: Whole wheat, barley, wheat berries, quinoa, oats, brown rice, and foods made with them. If you want pasta, go with whole wheat pasta.   Protein power - one-quarter of your plate: Fish, chicken, beans, and nuts are all healthy, versatile protein sources. Limit red meat.   The diet, however, does go beyond the plate, offering a few other suggestions.   Use healthy plant oils, such as olive, canola, soy, corn, sunflower and peanut. Check the labels, and avoid partially hydrogenated oil, which have unhealthy trans fats.   If youre thirsty, drink water. Coffee and tea are good in moderation, but skip sugary drinks and limit milk and dairy products to  one or two daily servings.   The type of carbohydrate in the diet is more important than the amount. Some sources of carbohydrates, such as vegetables, fruits, whole grains, and beans-are healthier than others.   Finally, stay active  Signed, Thomasene Ripple, DO  09/22/2021 1:20 PM    [facility]

## 2021-09-22 NOTE — Patient Instructions (Addendum)
Medication Instructions:  ?Your physician recommends that you continue on your current medications as directed. Please refer to the Current Medication list given to you today.  ?*If you need a refill on your cardiac medications before your next appointment, please call your pharmacy* ? ? ?Lab Work: ?Your physician recommends that you return for lab work in:  ?TODAY: HgbA1c ?If you have labs (blood work) drawn today and your tests are completely normal, you will receive your results only by: ?MyChart Message (if you have MyChart) OR ?A paper copy in the mail ?If you have any lab test that is abnormal or we need to change your treatment, we will call you to review the results. ? ? ?Testing/Procedures: ?Your physician has requested that you have an echocardiogram. Echocardiography is a painless test that uses sound waves to create images of your heart. It provides your doctor with information about the size and shape of your heart and how well your heart?s chambers and valves are working. This procedure takes approximately one hour. There are no restrictions for this procedure. ? ?Your physician has recommended that you wear an event monitor. Event monitors are medical devices that record the heart?s electrical activity. Doctors most often [place] these monitors to diagnose arrhythmias. Arrhythmias are problems with the speed or rhythm of the heartbeat. The monitor is a small, portable device. You can wear one while you do your normal daily activities. This is usually used to diagnose what is causing palpitations/syncope (passing out). ? ? ? ?Follow-Up: ?At [facility], you and your health needs are our priority.  As part of our continuing mission to provide you with exceptional heart care, we have created designated Provider Care Teams.  These Care Teams include your primary Cardiologist (physician) and Advanced Practice Providers (APPs -  Physician Assistants and Nurse Practitioners) who all work together to provide  you with the care you need, when you need it. ? ?We recommend signing up for the patient portal called "MyChart".  Sign up information is provided on this After Visit Summary.  MyChart is used to connect with patients for Virtual Visits (Telemedicine).  Patients are able to view lab/test results, encounter notes, upcoming appointments, etc.  Non-urgent messages can be sent to your provider as well.   ?To learn more about what you can do with MyChart, go to [email or web address].   ? ?Your next appointment:   ?12 week(s) ? ?The format for your next appointment:   ?In Person ? ?Provider:   ?Berniece Salines, DO   ? ? ?Other Instructions ?  ?

## 2021-09-23 LAB — HEMOGLOBIN A1C
Est. average glucose Bld gHb Est-mCnc: 111 mg/dL
Hgb A1c MFr Bld: 5.5 % (ref 4.8–5.6)

## 2021-09-30 ENCOUNTER — Ambulatory Visit: Payer: Self-pay | Admitting: Neurology

## 2021-10-03 ENCOUNTER — Other Ambulatory Visit (HOSPITAL_COMMUNITY): Payer: BC Managed Care – PPO

## 2022-01-16 ENCOUNTER — Ambulatory Visit: Payer: BC Managed Care – PPO | Admitting: Cardiology

## 2022-01-20 ENCOUNTER — Ambulatory Visit: Payer: BC Managed Care – PPO | Admitting: Cardiology

## 2022-01-30 ENCOUNTER — Telehealth: Payer: Self-pay | Admitting: Internal Medicine

## 2022-01-30 NOTE — Telephone Encounter (Signed)
Pt states Dr. Jenny Reichmann wanted him to get a colonoscopy and sent an order in March 2023 but no one has called him.    There is no referral for a colonoscopy but pt insists that they discussed it and that Dr Jenny Reichmann says he needs to have one.   Please advise.   Simona Huh: [phone]

## 2022-01-31 ENCOUNTER — Encounter: Payer: Self-pay | Admitting: Gastroenterology

## 2022-01-31 NOTE — Telephone Encounter (Signed)
Called pt to call back regarding his referral

## 2022-01-31 NOTE — Telephone Encounter (Signed)
Left message for patient to call  GI. Per chart, referral was sent and they tried contacting patient for appointment but did not get an answer. They also sent a letter to patient.

## 2022-03-24 ENCOUNTER — Other Ambulatory Visit: Payer: Self-pay

## 2022-03-24 ENCOUNTER — Ambulatory Visit (AMBULATORY_SURGERY_CENTER): Payer: Self-pay

## 2022-03-24 VITALS — Ht 69.0 in | Wt 173.0 lb

## 2022-03-24 DIAGNOSIS — Z1211 Encounter for screening for malignant neoplasm of colon: Secondary | ICD-10-CM

## 2022-03-24 MED ORDER — NA SULFATE-K SULFATE-MG SULF 17.5-3.13-1.6 GM/177ML PO SOLN: 1.0000 | Freq: Once | ORAL | 0 refills | Status: AC

## 2022-03-24 NOTE — Progress Notes (Signed)
Denies allergies to eggs or soy products. Denies complication of anesthesia or sedation. Denies use of weight loss medication. Denies use of O2.   Emmi instructions given for colonoscopy.  

## 2022-03-31 ENCOUNTER — Encounter: Payer: Self-pay | Admitting: Gastroenterology

## 2022-04-14 ENCOUNTER — Encounter: Payer: Self-pay | Admitting: Gastroenterology

## 2022-04-14 ENCOUNTER — Ambulatory Visit (AMBULATORY_SURGERY_CENTER): Payer: BC Managed Care – PPO | Admitting: Gastroenterology

## 2022-04-14 VITALS — BP 97/66 | HR 70 | Temp 98.4°F | Resp 12 | Ht 69.0 in | Wt 173.0 lb

## 2022-04-14 DIAGNOSIS — D120 Benign neoplasm of cecum: Secondary | ICD-10-CM

## 2022-04-14 DIAGNOSIS — K635 Polyp of colon: Secondary | ICD-10-CM

## 2022-04-14 DIAGNOSIS — Z1211 Encounter for screening for malignant neoplasm of colon: Secondary | ICD-10-CM

## 2022-04-14 MED ORDER — SODIUM CHLORIDE 0.9 % IV SOLN: 500.0000 mL | Freq: Once | INTRAVENOUS | Status: DC

## 2022-04-14 NOTE — Progress Notes (Signed)
History and Physical:  This patient presents for endoscopic testing for: Encounter Diagnosis  Name Primary?   Special screening for malignant neoplasms, colon Yes    51 year old man here for first screening colonoscopy. Patient denies chronic abdominal pain, rectal bleeding, constipation or diarrhea.   Patient is otherwise without complaints or active issues today.   Past Medical History: Past Medical History:  Diagnosis Date   Allergic rhinitis    Allergy    seasonal   Elevated PSA    Prostatitis    UTI (urinary tract infection)      Past Surgical History: Past Surgical History:  Procedure Laterality Date   negative for surgeries      Allergies: Allergies  Allergen Reactions   Penicillins Rash    This is a new finding as of 05/2012    Outpatient Meds: Current Outpatient Medications  Medication Sig Dispense Refill   levocetirizine (XYZAL) 5 MG tablet Take 5 mg by mouth every evening.     Multiple Vitamin (MULTIVITAMIN) tablet Take 1 tablet by mouth daily.     Current Facility-Administered Medications  Medication Dose Route Frequency Provider Last Rate Last Admin   0.9 %  sodium chloride infusion  500 mL Intravenous Once Nelida Meuse III, MD          ___________________________________________________________________ Objective   Exam:  BP 109/71   Pulse 64   Temp 98.4 F (36.9 C) (Temporal)   Resp 10   Ht '5\' 9"'$  (1.753 m)   Wt 173 lb (78.5 kg)   SpO2 100%   BMI 25.55 kg/m   CV: Regular without murmur, S1/S2 Resp: clear to auscultation bilaterally, normal RR and effort noted GI: soft, no tenderness, with active bowel sounds.   Assessment: Encounter Diagnosis  Name Primary?   Special screening for malignant neoplasms, colon Yes     Plan: Colonoscopy  The benefits and risks of the planned procedure were described in detail with the patient or (when appropriate) their health care proxy.  Risks were outlined as including, but not limited  to, bleeding, infection, perforation, adverse medication reaction leading to cardiac or pulmonary decompensation, pancreatitis (if ERCP).  The limitation of incomplete mucosal visualization was also discussed.  No guarantees or warranties were given.    The patient is appropriate for an endoscopic procedure in the ambulatory setting.   - Wilfrid Lund, MD

## 2022-04-14 NOTE — Patient Instructions (Signed)
Handout given on polyps.  YOU HAD AN ENDOSCOPIC PROCEDURE TODAY AT THE [facility]:   Refer to the procedure report that was given to you for any specific questions about what was found during the examination.  If the procedure report does not answer your questions, please call your gastroenterologist to clarify.  If you requested that your care partner not be given the details of your procedure findings, then the procedure report has been included in a sealed envelope for you to review at your convenience later.  YOU SHOULD EXPECT: Some feelings of bloating in the abdomen. Passage of more gas than usual.  Walking can help get rid of the air that was put into your GI tract during the procedure and reduce the bloating. If you had a lower endoscopy (such as a colonoscopy or flexible sigmoidoscopy) you may notice spotting of blood in your stool or on the toilet paper. If you underwent a bowel prep for your procedure, you may not have a normal bowel movement for a few days.  Please Note:  You might notice some irritation and congestion in your nose or some drainage.  This is from the oxygen used during your procedure.  There is no need for concern and it should clear up in a day or so.  SYMPTOMS TO REPORT IMMEDIATELY:  Following lower endoscopy (colonoscopy or flexible sigmoidoscopy):  Excessive amounts of blood in the stool  Significant tenderness or worsening of abdominal pains  Swelling of the abdomen that is new, acute  Fever of 100F or higher   For urgent or emergent issues, a gastroenterologist can be reached at any hour by calling [phone]. Do not use MyChart messaging for urgent concerns.    DIET:  We do recommend a small meal at first, but then you may proceed to your regular diet.  Drink plenty of fluids but you should avoid alcoholic beverages for 24 hours.  ACTIVITY:  You should plan to take it easy for the rest of today and you should NOT DRIVE or use heavy  machinery until tomorrow (because of the sedation medicines used during the test).    FOLLOW UP: Our staff will call the number listed on your records the next business day following your procedure.  We will call around 7:15- 8:00 am to check on you and address any questions or concerns that you may have regarding the information given to you following your procedure. If we do not reach you, we will leave a message.     If any biopsies were taken you will be contacted by phone or by letter within the next 1-3 weeks.  Please call us at [phone] if you have not heard about the biopsies in 3 weeks.    SIGNATURES/CONFIDENTIALITY: You and/or your care partner have signed paperwork which will be entered into your electronic medical record.  These signatures attest to the fact that that the information above on your After Visit Summary has been reviewed and is understood.  Full responsibility of the confidentiality of this discharge information lies with you and/or your care-partner. 

## 2022-04-14 NOTE — Op Note (Signed)
[place] Patient Name: Phillip Mccoy Procedure Date: 04/14/2022 2:23 PM MRN: [id number] Endoscopist: Mallie Mussel L. Loletha Carrow , MD Age: 51 Referring MD:  Date of Birth: 30-Apr-1971 Gender: Male Account #: [id number] Procedure:                Colonoscopy Indications:              Screening for colorectal malignant neoplasm, This                            is the patient's first colonoscopy Medicines:                Monitored Anesthesia Care Procedure:                Pre-Anesthesia Assessment:                           - Prior to the procedure, a History and Physical                            was performed, and patient medications and                            allergies were reviewed. The patient's tolerance of                            previous anesthesia was also reviewed. The risks                            and benefits of the procedure and the sedation                            options and risks were discussed with the patient.                            All questions were answered, and informed consent                            was obtained. Prior Anticoagulants: The patient has                            taken no previous anticoagulant or antiplatelet                            agents. ASA Grade Assessment: II - A patient with                            mild systemic disease. After reviewing the risks                            and benefits, the patient was deemed in                            satisfactory condition to undergo the procedure.  After obtaining informed consent, the colonoscope                            was passed under direct vision. Throughout the                            procedure, the patient's blood pressure, pulse, and                            oxygen saturations were monitored continuously. The                            Colonoscope was introduced through the anus and                            advanced to the the cecum,  identified by                            appendiceal orifice and ileocecal valve. The                            colonoscopy was performed without difficulty. The                            patient tolerated the procedure well. The quality                            of the bowel preparation was excellent. The                            ileocecal valve, appendiceal orifice, and rectum                            were photographed. The bowel preparation used was                            SUPREP. Scope In: 2:28:43 PM Scope Out: 2:40:49 PM Scope Withdrawal Time: 0 hours 9 minutes 29 seconds  Total Procedure Duration: 0 hours 12 minutes 6 seconds  Findings:                 The perianal and digital rectal examinations were                            normal.                           A diminutive polyp was found in the cecum. The                            polyp was semi-sessile. The polyp was removed with                            a cold snare. Resection and retrieval were complete.  Repeat examination of right colon under NBI                            performed.                           The exam was otherwise without abnormality on                            direct and retroflexion views. Complications:            No immediate complications. Estimated Blood Loss:     Estimated blood loss was minimal. Impression:               - One diminutive polyp in the cecum, removed with a                            cold snare. Resected and retrieved.                           - The examination was otherwise normal on direct                            and retroflexion views. Recommendation:           - Patient has a contact number available for                            emergencies. The signs and symptoms of potential                            delayed complications were discussed with the                            patient. Return to normal activities tomorrow.                             Written discharge instructions were provided to the                            patient.                           - Resume previous diet.                           - Continue present medications.                           - Await pathology results.                           - Repeat colonoscopy is recommended for                            surveillance. The colonoscopy date will be  determined after pathology results from today's                            exam become available for review. Jedaiah Rathbun L. Loletha Carrow, MD 04/14/2022 2:46:29 PM This report has been signed electronically.

## 2022-04-14 NOTE — Progress Notes (Signed)
Report to PACU, RN, vss, BBS= Clear.  

## 2022-04-14 NOTE — Progress Notes (Signed)
Pt's states no medical or surgical changes since previsit or office visit. 

## 2022-04-14 NOTE — Progress Notes (Signed)
Called to room to assist during endoscopic procedure.  Patient ID and intended procedure confirmed with present staff. Received instructions for my participation in the procedure from the performing physician.  

## 2022-04-17 ENCOUNTER — Telehealth: Payer: Self-pay | Admitting: *Deleted

## 2022-04-17 NOTE — Telephone Encounter (Signed)
  Follow up Call-     04/14/2022    1:20 PM  Call back number  Post procedure Call Back phone  # [phone]  Permission to leave phone message Yes     Patient questions:  Message left to call if necessary.

## 2022-04-27 ENCOUNTER — Encounter: Payer: Self-pay | Admitting: Gastroenterology

## 2022-06-03 DIAGNOSIS — J328 Other chronic sinusitis: Secondary | ICD-10-CM | POA: Diagnosis not present

## 2022-07-21 DIAGNOSIS — J309 Allergic rhinitis, unspecified: Secondary | ICD-10-CM | POA: Diagnosis not present

## 2022-07-22 DIAGNOSIS — J309 Allergic rhinitis, unspecified: Secondary | ICD-10-CM | POA: Diagnosis not present

## 2022-07-22 DIAGNOSIS — R0982 Postnasal drip: Secondary | ICD-10-CM | POA: Diagnosis not present

## 2022-07-22 DIAGNOSIS — R220 Localized swelling, mass and lump, head: Secondary | ICD-10-CM | POA: Diagnosis not present

## 2022-07-22 DIAGNOSIS — R0602 Shortness of breath: Secondary | ICD-10-CM | POA: Diagnosis not present

## 2022-08-23 ENCOUNTER — Ambulatory Visit (INDEPENDENT_AMBULATORY_CARE_PROVIDER_SITE_OTHER): Payer: BC Managed Care – PPO | Admitting: Internal Medicine

## 2022-08-23 VITALS — BP 128/66 | HR 78 | Temp 98.1°F | Ht 69.0 in | Wt 173.0 lb

## 2022-08-23 DIAGNOSIS — E538 Deficiency of other specified B group vitamins: Secondary | ICD-10-CM

## 2022-08-23 DIAGNOSIS — R739 Hyperglycemia, unspecified: Secondary | ICD-10-CM | POA: Diagnosis not present

## 2022-08-23 DIAGNOSIS — E559 Vitamin D deficiency, unspecified: Secondary | ICD-10-CM | POA: Diagnosis not present

## 2022-08-23 DIAGNOSIS — Z0001 Encounter for general adult medical examination with abnormal findings: Secondary | ICD-10-CM | POA: Diagnosis not present

## 2022-08-23 DIAGNOSIS — Z125 Encounter for screening for malignant neoplasm of prostate: Secondary | ICD-10-CM

## 2022-08-23 NOTE — Progress Notes (Signed)
Patient ID: Phillip Mccoy, male   DOB: [birth year], 52 y.o.   MRN: [id number]         Chief Complaint:: wellness exam        HPI:  Phillip Mccoy is a 52 y.o. male here for wellness exam; declines tdap, covid booster, flu shot, but might consider shingrix at pharmacy, o/w up to date               Also Pt denies chest pain, increased sob or doe, wheezing, orthopnea, PND, increased LE swelling, palpitations, dizziness or syncope.   Pt denies polydipsia, polyuria, or new focal neuro s/s.    Pt denies fever, wt loss, night sweats, loss of appetite, or other constitutional symptoms     Wt Readings from Last 3 Encounters:  08/23/22 173 lb (78.5 kg)  04/14/22 173 lb (78.5 kg)  03/24/22 173 lb (78.5 kg)   BP Readings from Last 3 Encounters:  08/23/22 128/66  04/14/22 97/66  09/22/21 124/66   Immunization History  Administered Date(s) Administered   [employer] Fall 2023 Covid-19 Vaccine 63mo thru 471yr 11/26/2019, 12/22/2019   Health Maintenance Due  Topic Date Due   DTaP/Tdap/Td (1 - Tdap) Never done      Past Medical History:  Diagnosis Date   Allergic rhinitis    Allergy    seasonal   Elevated PSA    Prostatitis    UTI (urinary tract infection)    Past Surgical History:  Procedure Laterality Date   negative for surgeries      reports that he has never smoked. He has never used smokeless tobacco. He reports that he does not drink alcohol and does not use drugs. family history includes COPD in his sister; Cancer in his father; Cancer (age of onset: 4626in his brother; Diabetes in his mother; Esophageal cancer in his brother; Hyperlipidemia in his father and mother; Lupus in his sister. Allergies  Allergen Reactions   Penicillins Rash    This is a new finding as of 05/2012   Current Outpatient Medications on File Prior to Visit  Medication Sig Dispense Refill   levocetirizine (XYZAL) 5 MG tablet Take 5 mg by mouth every evening.     Multiple Vitamin (MULTIVITAMIN) tablet Take 1  tablet by mouth daily.     No current facility-administered medications on file prior to visit.        ROS:  All others reviewed and negative.  Objective        PE:  BP 128/66 (BP Location: Left Arm, Patient Position: Sitting, Cuff Size: Large)   Pulse 78   Temp 98.1 F (36.7 C) (Oral)   Ht '5\' 9"'$  (1.753 m)   Wt 173 lb (78.5 kg)   SpO2 97%   BMI 25.55 kg/m                 Constitutional: Pt appears in NAD               HENT: Head: NCAT.                Right Ear: External ear normal.                 Left Ear: External ear normal.                Eyes: . Pupils are equal, round, and reactive to light. Conjunctivae and EOM are normal  Nose: without d/c or deformity               Neck: Neck supple. Gross normal ROM               Cardiovascular: Normal rate and regular rhythm.                 Pulmonary/Chest: Effort normal and breath sounds without rales or wheezing.                Abd:  Soft, NT, ND, + BS, no organomegaly               Neurological: Pt is alert. At baseline orientation, motor grossly intact               Skin: Skin is warm. No rashes, no other new lesions, LE edema - none               Psychiatric: Pt behavior is normal without agitation   Micro: none  Cardiac tracings I have personally interpreted today:  none  Pertinent Radiological findings (summarize): none   Lab Results  Component Value Date   WBC 5.5 08/23/2022   HGB 13.6 08/23/2022   HCT 39.2 08/23/2022   PLT 142.0 (L) 08/23/2022   GLUCOSE 89 08/23/2022   CHOL 174 08/23/2022   TRIG 120.0 08/23/2022   HDL 51.80 08/23/2022   LDLCALC 98 08/23/2022   ALT 17 08/23/2022   AST 19 08/23/2022   NA 142 08/23/2022   K 4.0 08/23/2022   CL 105 08/23/2022   CREATININE 1.11 08/23/2022   BUN 13 08/23/2022   CO2 31 08/23/2022   TSH 1.44 08/23/2022   PSA 0.80 08/23/2022   HGBA1C 5.9 08/23/2022   Assessment/Plan:  Phillip Mccoy is a 52 y.o. Black or African American [2] male with  has a past  medical history of Allergic rhinitis, Allergy, Elevated PSA, Prostatitis, and UTI (urinary tract infection).  Vitamin D deficiency Last vitamin D Lab Results  Component Value Date   VD25OH 16.36 (L) 08/13/2019   Low, to start oral replacement'   Encounter for well adult exam with abnormal findings Age and sex appropriate education and counseling updated with regular exercise and diet Referrals for preventative services - none needed Immunizations addressed - declines covid booster, tdap but may consider shingrix at pharmacy Smoking counseling  - none needed Evidence for depression or other mood disorder - none significant Most recent labs reviewed. I have personally reviewed and have noted: 1) the patient's medical and social history 2) The patient's current medications and supplements 3) The patient's height, weight, and BMI have been recorded in the chart  Followup: Return in about 1 year (around 08/24/2023).  Cathlean Cower, MD 08/27/2022 8:33 AM [facility]

## 2022-08-23 NOTE — Assessment & Plan Note (Signed)
Last vitamin D Lab Results  Component Value Date   VD25OH 16.36 (L) 08/13/2019   Low, to start oral replacement'

## 2022-08-23 NOTE — Patient Instructions (Signed)

## 2022-08-24 LAB — BASIC METABOLIC PANEL
BUN: 13 mg/dL (ref 6–23)
CO2: 31 mEq/L (ref 19–32)
Calcium: 9.5 mg/dL (ref 8.4–10.5)
Chloride: 105 mEq/L (ref 96–112)
Creatinine, Ser: 1.11 mg/dL (ref 0.40–1.50)
GFR: 75.57 mL/min (ref >60.00)
Glucose, Bld: 89 mg/dL (ref 70–99)
Potassium: 4 mEq/L (ref 3.5–5.1)
Sodium: 142 mEq/L (ref 135–145)

## 2022-08-24 LAB — CBC WITH DIFFERENTIAL/PLATELET
Basophils Absolute: 0.1 10*3/uL (ref 0.0–0.1)
Basophils Relative: 1.3 % (ref 0.0–3.0)
Eosinophils Absolute: 0.2 10*3/uL (ref 0.0–0.7)
Eosinophils Relative: 3.7 % (ref 0.0–5.0)
HCT: 39.2 % (ref 39.0–52.0)
Hemoglobin: 13.6 g/dL (ref 13.0–17.0)
Lymphocytes Relative: 40.6 % (ref 12.0–46.0)
Lymphs Abs: 2.2 10*3/uL (ref 0.7–4.0)
MCHC: 34.8 g/dL (ref 30.0–36.0)
MCV: 79.5 fl (ref 78.0–100.0)
Monocytes Absolute: 0.6 10*3/uL (ref 0.1–1.0)
Monocytes Relative: 10.2 % (ref 3.0–12.0)
Neutro Abs: 2.4 10*3/uL (ref 1.4–7.7)
Neutrophils Relative %: 44.2 % (ref 43.0–77.0)
Platelets: 142 10*3/uL — ABNORMAL LOW (ref 150.0–400.0)
RBC: 4.93 Mil/uL (ref 4.22–5.81)
RDW: 13 % (ref 11.5–15.5)
WBC: 5.5 10*3/uL (ref 4.0–10.5)

## 2022-08-24 LAB — URINALYSIS, ROUTINE W REFLEX MICROSCOPIC
Bilirubin Urine: NEGATIVE
Hgb urine dipstick: NEGATIVE
Ketones, ur: NEGATIVE
Leukocytes,Ua: NEGATIVE
Nitrite: NEGATIVE
RBC / HPF: NONE SEEN (ref 0–?)
Specific Gravity, Urine: 1.025 (ref 1.000–1.030)
Total Protein, Urine: NEGATIVE
Urine Glucose: NEGATIVE
Urobilinogen, UA: 0.2 (ref 0.0–1.0)
pH: 6.5 (ref 5.0–8.0)

## 2022-08-24 LAB — HEPATIC FUNCTION PANEL
ALT: 17 U/L (ref 0–53)
AST: 19 U/L (ref 0–37)
Albumin: 4.5 g/dL (ref 3.5–5.2)
Alkaline Phosphatase: 38 U/L — ABNORMAL LOW (ref 39–117)
Bilirubin, Direct: 0.2 mg/dL (ref 0.0–0.3)
Total Bilirubin: 1.1 mg/dL (ref 0.2–1.2)
Total Protein: 6.2 g/dL (ref 6.0–8.3)

## 2022-08-24 LAB — LIPID PANEL
Cholesterol: 174 mg/dL (ref 0–200)
HDL: 51.8 mg/dL (ref >39.00)
LDL Cholesterol: 98 mg/dL (ref 0–99)
NonHDL: 122.42
Total CHOL/HDL Ratio: 3
Triglycerides: 120 mg/dL (ref 0.0–149.0)
VLDL: 24 mg/dL (ref 0.0–40.0)

## 2022-08-24 LAB — HEMOGLOBIN A1C: Hgb A1c MFr Bld: 5.9 % (ref 4.6–6.5)

## 2022-08-24 LAB — PSA: PSA: 0.8 ng/mL (ref 0.10–4.00)

## 2022-08-24 LAB — VITAMIN B12: Vitamin B-12: 503 pg/mL (ref 211–911)

## 2022-08-24 LAB — TSH: TSH: 1.44 u[IU]/mL (ref 0.35–5.50)

## 2022-08-24 LAB — VITAMIN D 25 HYDROXY (VIT D DEFICIENCY, FRACTURES): VITD: 12.35 ng/mL — ABNORMAL LOW (ref 30.00–100.00)

## 2022-08-27 ENCOUNTER — Encounter: Payer: Self-pay | Admitting: Internal Medicine

## 2022-08-27 NOTE — Assessment & Plan Note (Signed)
Age and sex appropriate education and counseling updated with regular exercise and diet Referrals for preventative services - none needed Immunizations addressed - declines covid booster, tdap but may consider shingrix at pharmacy Smoking counseling  - none needed Evidence for depression or other mood disorder - none significant Most recent labs reviewed. I have personally reviewed and have noted: 1) the patient's medical and social history 2) The patient's current medications and supplements 3) The patient's height, weight, and BMI have been recorded in the chart

## 2023-08-02 DIAGNOSIS — Q5561 Curvature of penis (lateral): Secondary | ICD-10-CM | POA: Diagnosis not present

## 2023-12-19 DIAGNOSIS — J0100 Acute maxillary sinusitis, unspecified: Secondary | ICD-10-CM | POA: Diagnosis not present

## 2023-12-19 DIAGNOSIS — R0982 Postnasal drip: Secondary | ICD-10-CM | POA: Diagnosis not present

## 2024-07-16 ENCOUNTER — Ambulatory Visit: Admitting: Internal Medicine

## 2024-07-16 VITALS — BP 108/66 | HR 87 | Temp 97.8°F | Ht 69.0 in | Wt 179.5 lb

## 2024-07-16 DIAGNOSIS — Z125 Encounter for screening for malignant neoplasm of prostate: Secondary | ICD-10-CM | POA: Diagnosis not present

## 2024-07-16 DIAGNOSIS — Z0000 Encounter for general adult medical examination without abnormal findings: Secondary | ICD-10-CM

## 2024-07-16 DIAGNOSIS — R252 Cramp and spasm: Secondary | ICD-10-CM

## 2024-07-16 DIAGNOSIS — E559 Vitamin D deficiency, unspecified: Secondary | ICD-10-CM

## 2024-07-16 DIAGNOSIS — E538 Deficiency of other specified B group vitamins: Secondary | ICD-10-CM | POA: Diagnosis not present

## 2024-07-16 DIAGNOSIS — Z0001 Encounter for general adult medical examination with abnormal findings: Secondary | ICD-10-CM

## 2024-07-16 DIAGNOSIS — R5383 Other fatigue: Secondary | ICD-10-CM

## 2024-07-16 DIAGNOSIS — R972 Elevated prostate specific antigen [PSA]: Secondary | ICD-10-CM

## 2024-07-16 DIAGNOSIS — R739 Hyperglycemia, unspecified: Secondary | ICD-10-CM | POA: Diagnosis not present

## 2024-07-16 DIAGNOSIS — J309 Allergic rhinitis, unspecified: Secondary | ICD-10-CM | POA: Diagnosis not present

## 2024-07-16 LAB — CBC WITH DIFFERENTIAL/PLATELET
Basophils Absolute: 0 K/uL (ref 0.0–0.1)
Basophils Relative: 0.7 % (ref 0.0–3.0)
Eosinophils Absolute: 0.2 K/uL (ref 0.0–0.7)
Eosinophils Relative: 2.5 % (ref 0.0–5.0)
HCT: 40.1 % (ref 39.0–52.0)
Hemoglobin: 14.1 g/dL (ref 13.0–17.0)
Lymphocytes Relative: 38.8 % (ref 12.0–46.0)
Lymphs Abs: 2.4 K/uL (ref 0.7–4.0)
MCHC: 35.1 g/dL (ref 30.0–36.0)
MCV: 79.7 fl (ref 78.0–100.0)
Monocytes Absolute: 0.6 K/uL (ref 0.1–1.0)
Monocytes Relative: 9.3 % (ref 3.0–12.0)
Neutro Abs: 3 K/uL (ref 1.4–7.7)
Neutrophils Relative %: 48.7 % (ref 43.0–77.0)
Platelets: 134 K/uL — ABNORMAL LOW (ref 150.0–400.0)
RBC: 5.03 Mil/uL (ref 4.22–5.81)
RDW: 13 % (ref 11.5–15.5)
WBC: 6.1 K/uL (ref 4.0–10.5)

## 2024-07-16 LAB — URINALYSIS, ROUTINE W REFLEX MICROSCOPIC
Bilirubin Urine: NEGATIVE
Hgb urine dipstick: NEGATIVE
Ketones, ur: NEGATIVE
Leukocytes,Ua: NEGATIVE
Nitrite: NEGATIVE
RBC / HPF: NONE SEEN
Specific Gravity, Urine: <=1.005 — AB (ref 1.000–1.030)
Total Protein, Urine: NEGATIVE
Urine Glucose: NEGATIVE
Urobilinogen, UA: 0.2 (ref 0.0–1.0)
WBC, UA: NONE SEEN
pH: 7 (ref 5.0–8.0)

## 2024-07-16 LAB — BASIC METABOLIC PANEL WITH GFR
BUN: 9 mg/dL (ref 6–23)
CO2: 32 meq/L (ref 19–32)
Calcium: 9.1 mg/dL (ref 8.4–10.5)
Chloride: 103 meq/L (ref 96–112)
Creatinine, Ser: 1.02 mg/dL (ref 0.40–1.50)
GFR: 82.54 mL/min
Glucose, Bld: 97 mg/dL (ref 70–99)
Potassium: 3.9 meq/L (ref 3.5–5.1)
Sodium: 139 meq/L (ref 135–145)

## 2024-07-16 LAB — HEPATIC FUNCTION PANEL
ALT: 22 U/L (ref 3–53)
AST: 19 U/L (ref 5–37)
Albumin: 4.5 g/dL (ref 3.5–5.2)
Alkaline Phosphatase: 41 U/L (ref 39–117)
Bilirubin, Direct: 0.1 mg/dL (ref 0.1–0.3)
Total Bilirubin: 0.8 mg/dL (ref 0.2–1.2)
Total Protein: 6.3 g/dL (ref 6.0–8.3)

## 2024-07-16 LAB — LIPID PANEL
Cholesterol: 182 mg/dL (ref 28–200)
HDL: 53 mg/dL
LDL Cholesterol: 92 mg/dL (ref 10–99)
NonHDL: 128.86
Total CHOL/HDL Ratio: 3
Triglycerides: 183 mg/dL — ABNORMAL HIGH (ref 10.0–149.0)
VLDL: 36.6 mg/dL (ref 0.0–40.0)

## 2024-07-16 LAB — PSA: PSA: 0.8 ng/mL (ref 0.10–4.00)

## 2024-07-16 LAB — VITAMIN B12: Vitamin B-12: 360 pg/mL (ref 211–911)

## 2024-07-16 LAB — TESTOSTERONE: Testosterone: 195.47 ng/dL — ABNORMAL LOW (ref 300.00–890.00)

## 2024-07-16 LAB — HEMOGLOBIN A1C: Hgb A1c MFr Bld: 5.7 % (ref 4.6–6.5)

## 2024-07-16 LAB — TSH: TSH: 1.87 u[IU]/mL (ref 0.35–5.50)

## 2024-07-16 MED ORDER — CYCLOBENZAPRINE HCL 5 MG PO TABS: 5.0000 mg | ORAL_TABLET | Freq: Three times a day (TID) | ORAL | 1 refills | Status: AC | PRN

## 2024-07-16 NOTE — Progress Notes (Signed)
 Patient ID: Phillip Mccoy, male   DOB: [birth year], 54 y.o.   MRN: [id number]         Chief Complaint:: wellness exam and leg cramps, allergies, low energy fatigue,        HPI:  Phillip Mccoy is a 54 y.o. male here for wellness exam ; declines all vaccinations today, o/w up to date                         Also Does have several wks ongoing nasal allergy symptoms with clearish congestion, itch and sneezing, without fever, pain, ST, cough, swelling or wheezing.  Pt denies chest pain, increased sob or doe, wheezing, orthopnea, PND, increased LE swelling, palpitations, dizziness or syncope.   Pt denies polydipsia, polyuria, or new focal neuro s/s.   Does c/o ongoing fatigue, but denies signficant daytime hypersomnolence.  Does have recent bilateral upper leg cramps worse at night with lower po intake recently, and had an episode of falling to hands and knees without injury after dizziness.  Has been working on increased po fluids and no further symtpoms.     Wt Readings from Last 3 Encounters:  07/16/24 179 lb 8 oz (81.4 kg)  08/23/22 173 lb (78.5 kg)  04/14/22 173 lb (78.5 kg)   BP Readings from Last 3 Encounters:  07/16/24 108/66  08/23/22 128/66  04/14/22 97/66   Immunization History  Administered Date(s) Administered   [employer] Covid-19 vaccine 6mos thru 8yrs 11/26/2019, 12/22/2019   Health Maintenance Due  Topic Date Due   DTaP/Tdap/Td (1 - Tdap) Never done   Hepatitis B Vaccines 19-59 Average Risk (1 of 3 - 19+ 3-dose series) Never done   Pneumococcal Vaccine: 50+ Years (1 of 1 - PCV) Never done   Zoster Vaccines- Shingrix (1 of 2) Never done   Influenza Vaccine  Never done      Past Medical History:  Diagnosis Date   Allergic rhinitis    Allergy    seasonal   Elevated PSA    Prostatitis    UTI (urinary tract infection)    Past Surgical History:  Procedure Laterality Date   negative for surgeries      reports that he has never smoked. He has never used smokeless  tobacco. He reports that he does not drink alcohol  and does not use drugs. family history includes COPD in his sister; Cancer in his father; Cancer (age of onset: 68) in his brother; Diabetes in his mother; Esophageal cancer in his brother; Hyperlipidemia in his father and mother; Lupus in his sister. Allergies[1] Medications Ordered Prior to Encounter[2]      ROS:  All others reviewed and negative.  Objective        PE:  BP 108/66   Pulse 87   Temp 97.8 F (36.6 C) (Temporal)   Ht 5' 9 (1.753 m)   Wt 179 lb 8 oz (81.4 kg)   SpO2 97%   BMI 26.51 kg/m                 Constitutional: Pt appears in NAD               HENT: Head: NCAT.                Right Ear: External ear normal.                 Left Ear: External ear normal. Bilat tm's with mild erythema.  Max  sinus areas non tender.  Pharynx with mild erythema, no exudate               Eyes: . Pupils are equal, round, and reactive to light. Conjunctivae and EOM are normal               Nose: without d/c or deformity               Neck: Neck supple. Gross normal ROM               Cardiovascular: Normal rate and regular rhythm.                 Pulmonary/Chest: Effort normal and breath sounds without rales or wheezing.                Abd:  Soft, NT, ND, + BS, no organomegaly               Neurological: Pt is alert. At baseline orientation, motor grossly intact               Skin: Skin is warm. No rashes, no other new lesions, LE edema - none               Psychiatric: Pt behavior is normal without agitation   Micro: none  Cardiac tracings I have personally interpreted today:  none  Pertinent Radiological findings (summarize): none   Lab Results  Component Value Date   WBC 6.1 07/16/2024   HGB 14.1 07/16/2024   HCT 40.1 07/16/2024   PLT 134.0 (L) 07/16/2024   GLUCOSE 97 07/16/2024   CHOL 182 07/16/2024   TRIG 183.0 (H) 07/16/2024   HDL 53.00 07/16/2024   LDLCALC 92 07/16/2024   ALT 22 07/16/2024   AST 19 07/16/2024   NA  139 07/16/2024   K 3.9 07/16/2024   CL 103 07/16/2024   CREATININE 1.02 07/16/2024   BUN 9 07/16/2024   CO2 32 07/16/2024   TSH 1.87 07/16/2024   PSA 0.80 07/16/2024   HGBA1C 5.7 07/16/2024   Assessment/Plan:  Phillip Mccoy is a 54 y.o. Black or African American [2] male with  has a past medical history of Allergic rhinitis, Allergy, Elevated PSA, Prostatitis, and UTI (urinary tract infection).  Encounter for well adult exam with abnormal findings Age and sex appropriate education and counseling updated with regular exercise and diet Referrals for preventative services - none needed Immunizations addressed - declines all vaccination today Smoking counseling  - none needed Evidence for depression or other mood disorder - none significant Most recent labs reviewed. I have personally reviewed and have noted: 1) the patient's medical and social history 2) The patient's current medications and supplements 3) The patient's height, weight, and BMI have been recorded in the chart   Vitamin D  deficiency Last vitamin D  Lab Results  Component Value Date   VD25OH 12.35 (L) 08/23/2022   Low, to start oral replacement   Elevated PSA Lab Results  Component Value Date   PSA1 8.8 (H) 05/05/2017   PSA 0.80 07/16/2024   PSA 0.80 08/23/2022   PSA 1.54 08/13/2019   Overall stable, pt to f/u urology as planned  Allergic rhinitis Mild to mod, for allegra and /or nasacort asd,,  to f/u any worsening symptoms or concerns  Other fatigue Also for testosterone  level with labs,  to f/u any worsening symptoms or concerns  Leg cramping Exam benign, for check renal fxn and electrolytes, continue working on maintaining hydration,  for flexeril  5 mg prn recurrence  Followup: Return in about 1 year (around 07/16/2025).  Phillip Rush, MD 07/18/2024 6:15 AM [facility]     [1]  Allergies Allergen Reactions   Penicillins Rash     This is a new finding as of 05/2012  [2]  Current Outpatient Medications on File Prior to Visit  Medication Sig Dispense Refill   levocetirizine (XYZAL) 5 MG tablet Take 5 mg by mouth every evening.     Multiple Vitamin (MULTIVITAMIN) tablet Take 1 tablet by mouth daily.     No current facility-administered medications on file prior to visit.

## 2024-07-16 NOTE — Patient Instructions (Signed)
 Please take all new medication as prescribed  - the  muscle relaxer as needed  Ok to use the OTC Allegra and/or Nasacort for allergies  Please continue all other medications as before, and refills have been done if requested.  Please have the pharmacy call with any other refills you may need.  Please continue your efforts at being more active, low cholesterol diet, and weight control.  You are otherwise up to date with prevention measures today.  Please keep your appointments with your specialists as you may have planned  Please go to the LAB at the blood drawing area for the tests to be done  You will be contacted by phone if any changes need to be made immediately.  Otherwise, you will receive a letter about your results with an explanation, but please check with MyChart first.  Please make an Appointment to return for your 1 year visit, or sooner if needed

## 2024-07-17 ENCOUNTER — Other Ambulatory Visit: Payer: Self-pay | Admitting: Internal Medicine

## 2024-07-17 ENCOUNTER — Ambulatory Visit: Payer: Self-pay | Admitting: Internal Medicine

## 2024-07-17 MED ORDER — TESTOSTERONE 50 MG/5GM (1%) TD GEL: 5.0000 g | Freq: Every day | TRANSDERMAL | 1 refills | Status: AC

## 2024-07-18 ENCOUNTER — Encounter: Payer: Self-pay | Admitting: Internal Medicine

## 2024-07-18 DIAGNOSIS — R5383 Other fatigue: Secondary | ICD-10-CM | POA: Insufficient documentation

## 2024-07-18 DIAGNOSIS — R252 Cramp and spasm: Secondary | ICD-10-CM | POA: Insufficient documentation

## 2024-07-18 NOTE — Assessment & Plan Note (Signed)
 Last vitamin D  Lab Results  Component Value Date   VD25OH 12.35 (L) 08/23/2022   Low, to start oral replacement

## 2024-07-18 NOTE — Assessment & Plan Note (Signed)
 Age and sex appropriate education and counseling updated with regular exercise and diet Referrals for preventative services - none needed Immunizations addressed - declines all vaccination today Smoking counseling  - none needed Evidence for depression or other mood disorder - none significant Most recent labs reviewed. I have personally reviewed and have noted: 1) the patient's medical and social history 2) The patient's current medications and supplements 3) The patient's height, weight, and BMI have been recorded in the chart

## 2024-07-18 NOTE — Assessment & Plan Note (Addendum)
 Exam benign, for check renal fxn and electrolytes, continue working on maintaining hydration, for flexeril  5 mg prn recurrence

## 2024-07-18 NOTE — Assessment & Plan Note (Signed)
 Lab Results  Component Value Date   PSA1 8.8 (H) 05/05/2017   PSA 0.80 07/16/2024   PSA 0.80 08/23/2022   PSA 1.54 08/13/2019   Overall stable, pt to f/u urology as planned

## 2024-07-18 NOTE — Assessment & Plan Note (Signed)
 Mild to mod, for allegra and /or nasacort asd,,  to f/u any worsening symptoms or concerns

## 2024-07-18 NOTE — Assessment & Plan Note (Signed)
 Also for testosterone  level with labs,  to f/u any worsening symptoms or concerns
